# Patient Record
Sex: Female | Born: 1970 | Race: White | Hispanic: No | Smoking: Never smoker
Health system: Southern US, Community
[De-identification: ages and names within clinical notes are randomized; demographics above are authoritative.]

## PROBLEM LIST (undated history)

## (undated) DIAGNOSIS — I10 Essential (primary) hypertension: Secondary | ICD-10-CM

## (undated) HISTORY — DX: Essential (primary) hypertension: I10

## (undated) HISTORY — PX: CHOLECYSTECTOMY: SHX55

---

## 1998-06-04 ENCOUNTER — Other Ambulatory Visit: Admission: RE | Admit: 1998-06-04 | Discharge: 1998-06-04 | Payer: Self-pay | Admitting: Obstetrics & Gynecology

## 1998-12-20 ENCOUNTER — Inpatient Hospital Stay (HOSPITAL_COMMUNITY): Admission: AD | Admit: 1998-12-20 | Discharge: 1998-12-24 | Payer: Self-pay | Admitting: Obstetrics & Gynecology

## 2000-03-13 ENCOUNTER — Other Ambulatory Visit: Admission: RE | Admit: 2000-03-13 | Discharge: 2000-03-13 | Payer: Self-pay | Admitting: Obstetrics & Gynecology

## 2001-04-12 ENCOUNTER — Other Ambulatory Visit: Admission: RE | Admit: 2001-04-12 | Discharge: 2001-04-12 | Payer: Self-pay | Admitting: Obstetrics & Gynecology

## 2002-04-23 ENCOUNTER — Other Ambulatory Visit: Admission: RE | Admit: 2002-04-23 | Discharge: 2002-04-23 | Payer: Self-pay | Admitting: Obstetrics & Gynecology

## 2003-07-03 ENCOUNTER — Other Ambulatory Visit: Admission: RE | Admit: 2003-07-03 | Discharge: 2003-07-03 | Payer: Self-pay | Admitting: Obstetrics & Gynecology

## 2004-08-18 ENCOUNTER — Other Ambulatory Visit: Admission: RE | Admit: 2004-08-18 | Discharge: 2004-08-18 | Payer: Self-pay | Admitting: Obstetrics & Gynecology

## 2004-11-21 HISTORY — PX: PARTIAL HYSTERECTOMY: SHX80

## 2005-02-23 ENCOUNTER — Observation Stay (HOSPITAL_COMMUNITY): Admission: RE | Admit: 2005-02-23 | Discharge: 2005-02-24 | Payer: Self-pay | Admitting: Obstetrics & Gynecology

## 2005-09-15 ENCOUNTER — Other Ambulatory Visit: Admission: RE | Admit: 2005-09-15 | Discharge: 2005-09-15 | Payer: Self-pay | Admitting: Obstetrics and Gynecology

## 2012-08-16 ENCOUNTER — Other Ambulatory Visit: Payer: Self-pay | Admitting: Obstetrics & Gynecology

## 2015-03-02 ENCOUNTER — Other Ambulatory Visit: Payer: Self-pay | Admitting: Obstetrics & Gynecology

## 2015-03-02 DIAGNOSIS — N6315 Unspecified lump in the right breast, overlapping quadrants: Secondary | ICD-10-CM

## 2015-03-02 DIAGNOSIS — N6310 Unspecified lump in the right breast, unspecified quadrant: Principal | ICD-10-CM

## 2015-03-04 ENCOUNTER — Ambulatory Visit
Admission: RE | Admit: 2015-03-04 | Discharge: 2015-03-04 | Disposition: A | Discharge status: Home / Self Care | Payer: BLUE CROSS/BLUE SHIELD | Source: Ambulatory Visit | Attending: Obstetrics & Gynecology | Admitting: Obstetrics & Gynecology

## 2015-03-04 DIAGNOSIS — N6310 Unspecified lump in the right breast, unspecified quadrant: Principal | ICD-10-CM

## 2015-03-04 DIAGNOSIS — N6315 Unspecified lump in the right breast, overlapping quadrants: Secondary | ICD-10-CM

## 2016-01-18 ENCOUNTER — Other Ambulatory Visit: Payer: Self-pay | Admitting: Obstetrics & Gynecology

## 2016-01-18 DIAGNOSIS — N63 Unspecified lump in unspecified breast: Secondary | ICD-10-CM

## 2016-01-21 ENCOUNTER — Other Ambulatory Visit: Payer: Self-pay | Admitting: Obstetrics & Gynecology

## 2016-01-21 ENCOUNTER — Ambulatory Visit
Admission: RE | Admit: 2016-01-21 | Discharge: 2016-01-21 | Disposition: A | Discharge status: Home / Self Care | Payer: BLUE CROSS/BLUE SHIELD | Source: Ambulatory Visit | Attending: Obstetrics & Gynecology | Admitting: Obstetrics & Gynecology

## 2016-01-21 DIAGNOSIS — N63 Unspecified lump in unspecified breast: Secondary | ICD-10-CM

## 2017-05-03 ENCOUNTER — Other Ambulatory Visit: Payer: Self-pay | Admitting: Obstetrics & Gynecology

## 2017-05-03 DIAGNOSIS — N63 Unspecified lump in unspecified breast: Secondary | ICD-10-CM

## 2017-05-05 ENCOUNTER — Other Ambulatory Visit: Payer: BLUE CROSS/BLUE SHIELD

## 2017-05-05 ENCOUNTER — Inpatient Hospital Stay
Admission: RE | Admit: 2017-05-05 | Discharge: 2017-05-05 | Disposition: A | Discharge status: Home / Self Care | Payer: BLUE CROSS/BLUE SHIELD | Source: Ambulatory Visit | Attending: Obstetrics & Gynecology | Admitting: Obstetrics & Gynecology

## 2017-05-15 ENCOUNTER — Other Ambulatory Visit: Payer: BLUE CROSS/BLUE SHIELD

## 2017-06-07 ENCOUNTER — Ambulatory Visit
Admission: RE | Admit: 2017-06-07 | Discharge: 2017-06-07 | Disposition: A | Discharge status: Home / Self Care | Payer: BLUE CROSS/BLUE SHIELD | Source: Ambulatory Visit | Attending: Obstetrics & Gynecology | Admitting: Obstetrics & Gynecology

## 2017-06-07 ENCOUNTER — Other Ambulatory Visit: Payer: Self-pay | Admitting: Obstetrics & Gynecology

## 2017-06-07 DIAGNOSIS — N63 Unspecified lump in unspecified breast: Secondary | ICD-10-CM

## 2017-06-27 ENCOUNTER — Other Ambulatory Visit: Payer: Self-pay | Admitting: Obstetrics & Gynecology

## 2017-06-27 DIAGNOSIS — N6002 Solitary cyst of left breast: Secondary | ICD-10-CM

## 2017-07-03 ENCOUNTER — Ambulatory Visit
Admission: RE | Admit: 2017-07-03 | Discharge: 2017-07-03 | Disposition: A | Discharge status: Home / Self Care | Payer: BLUE CROSS/BLUE SHIELD | Source: Ambulatory Visit | Attending: Obstetrics & Gynecology | Admitting: Obstetrics & Gynecology

## 2017-07-03 ENCOUNTER — Other Ambulatory Visit: Payer: Self-pay | Admitting: Obstetrics & Gynecology

## 2017-07-03 DIAGNOSIS — N6002 Solitary cyst of left breast: Secondary | ICD-10-CM

## 2017-11-29 DIAGNOSIS — J22 Unspecified acute lower respiratory infection: Secondary | ICD-10-CM | POA: Diagnosis not present

## 2017-12-25 DIAGNOSIS — M545 Low back pain: Secondary | ICD-10-CM | POA: Diagnosis not present

## 2017-12-25 DIAGNOSIS — M5416 Radiculopathy, lumbar region: Secondary | ICD-10-CM | POA: Diagnosis not present

## 2017-12-27 DIAGNOSIS — M545 Low back pain: Secondary | ICD-10-CM | POA: Diagnosis not present

## 2018-01-05 DIAGNOSIS — M545 Low back pain: Secondary | ICD-10-CM | POA: Diagnosis not present

## 2018-01-22 DIAGNOSIS — E669 Obesity, unspecified: Secondary | ICD-10-CM | POA: Diagnosis not present

## 2018-01-22 DIAGNOSIS — I10 Essential (primary) hypertension: Secondary | ICD-10-CM | POA: Diagnosis not present

## 2018-01-22 DIAGNOSIS — M545 Low back pain: Secondary | ICD-10-CM | POA: Diagnosis not present

## 2018-01-22 DIAGNOSIS — M5416 Radiculopathy, lumbar region: Secondary | ICD-10-CM | POA: Diagnosis not present

## 2018-02-01 DIAGNOSIS — M545 Low back pain: Secondary | ICD-10-CM | POA: Diagnosis not present

## 2018-02-01 DIAGNOSIS — M5416 Radiculopathy, lumbar region: Secondary | ICD-10-CM | POA: Diagnosis not present

## 2018-02-05 DIAGNOSIS — M545 Low back pain: Secondary | ICD-10-CM | POA: Diagnosis not present

## 2018-02-05 DIAGNOSIS — M5416 Radiculopathy, lumbar region: Secondary | ICD-10-CM | POA: Diagnosis not present

## 2018-02-08 DIAGNOSIS — M5416 Radiculopathy, lumbar region: Secondary | ICD-10-CM | POA: Diagnosis not present

## 2018-02-08 DIAGNOSIS — M545 Low back pain: Secondary | ICD-10-CM | POA: Diagnosis not present

## 2018-05-16 DIAGNOSIS — Z1231 Encounter for screening mammogram for malignant neoplasm of breast: Secondary | ICD-10-CM | POA: Diagnosis not present

## 2018-05-16 DIAGNOSIS — Z01419 Encounter for gynecological examination (general) (routine) without abnormal findings: Secondary | ICD-10-CM | POA: Diagnosis not present

## 2018-05-16 DIAGNOSIS — Z6831 Body mass index (BMI) 31.0-31.9, adult: Secondary | ICD-10-CM | POA: Diagnosis not present

## 2018-05-28 DIAGNOSIS — J0100 Acute maxillary sinusitis, unspecified: Secondary | ICD-10-CM | POA: Diagnosis not present

## 2018-06-04 DIAGNOSIS — J0100 Acute maxillary sinusitis, unspecified: Secondary | ICD-10-CM | POA: Diagnosis not present

## 2018-06-19 DIAGNOSIS — R0981 Nasal congestion: Secondary | ICD-10-CM | POA: Diagnosis not present

## 2018-06-19 DIAGNOSIS — J320 Chronic maxillary sinusitis: Secondary | ICD-10-CM | POA: Diagnosis not present

## 2018-07-20 DIAGNOSIS — D1801 Hemangioma of skin and subcutaneous tissue: Secondary | ICD-10-CM | POA: Diagnosis not present

## 2018-07-20 DIAGNOSIS — D485 Neoplasm of uncertain behavior of skin: Secondary | ICD-10-CM | POA: Diagnosis not present

## 2018-07-20 DIAGNOSIS — D225 Melanocytic nevi of trunk: Secondary | ICD-10-CM | POA: Diagnosis not present

## 2018-07-20 DIAGNOSIS — D2262 Melanocytic nevi of left upper limb, including shoulder: Secondary | ICD-10-CM | POA: Diagnosis not present

## 2018-09-27 DIAGNOSIS — D492 Neoplasm of unspecified behavior of bone, soft tissue, and skin: Secondary | ICD-10-CM | POA: Diagnosis not present

## 2018-09-27 DIAGNOSIS — M79671 Pain in right foot: Secondary | ICD-10-CM | POA: Diagnosis not present

## 2018-09-27 DIAGNOSIS — Z6831 Body mass index (BMI) 31.0-31.9, adult: Secondary | ICD-10-CM | POA: Diagnosis not present

## 2018-09-27 DIAGNOSIS — M7731 Calcaneal spur, right foot: Secondary | ICD-10-CM | POA: Diagnosis not present

## 2018-10-04 DIAGNOSIS — D1801 Hemangioma of skin and subcutaneous tissue: Secondary | ICD-10-CM | POA: Diagnosis not present

## 2018-10-09 ENCOUNTER — Other Ambulatory Visit: Payer: Self-pay

## 2018-10-09 ENCOUNTER — Ambulatory Visit: Payer: BLUE CROSS/BLUE SHIELD | Admitting: Podiatry

## 2018-10-09 ENCOUNTER — Encounter: Payer: Self-pay | Admitting: Podiatry

## 2018-10-09 VITALS — BP 133/84 | HR 88 | Resp 16 | Ht 60.0 in | Wt 155.0 lb

## 2018-10-09 DIAGNOSIS — M7751 Other enthesopathy of right foot: Secondary | ICD-10-CM | POA: Diagnosis not present

## 2018-10-09 DIAGNOSIS — M79674 Pain in right toe(s): Secondary | ICD-10-CM

## 2018-10-09 DIAGNOSIS — M8430XA Stress fracture, unspecified site, initial encounter for fracture: Secondary | ICD-10-CM

## 2018-10-09 DIAGNOSIS — M659 Synovitis and tenosynovitis, unspecified: Secondary | ICD-10-CM | POA: Diagnosis not present

## 2018-10-09 DIAGNOSIS — M7989 Other specified soft tissue disorders: Secondary | ICD-10-CM

## 2018-10-09 MED ORDER — DICLOFENAC SODIUM 75 MG PO TBEC: 75.0000 mg | DELAYED_RELEASE_TABLET | Freq: Two times a day (BID) | ORAL | 0 refills | Status: DC

## 2018-10-09 NOTE — Progress Notes (Signed)
   Subjective:    Patient ID: Brenda Riddle, female    DOB: Apr 14, 1971, 47 y.o.   MRN: [id number]  HPI    Review of Systems  Musculoskeletal: Positive for arthralgias and myalgias.  All other systems reviewed and are negative.      Objective:   Physical Exam        Assessment & Plan:

## 2018-10-10 NOTE — Progress Notes (Signed)
  Subjective:  Patient ID: Brenda Riddle, female    DOB: Apr 21, 1971,  MRN: [id number]  Chief Complaint  Patient presents with  . Foot Pain    R sub met 2 x Mid Sept; 5/10 pain -no known injury but it started after moving houses Tx: aleve, and OTC inserts   47 y.o. female presents with the above complaint. History as above.  Review of Systems: Negative except as noted in the HPI. Denies N/V/F/Ch.  No past medical history on file.  Current Outpatient Medications:  .  fluticasone (FLONASE) 50 MCG/ACT nasal spray, INSTILL 2 SPRAYS EACH NOSTRIL EVERY NIGHT., Disp: , Rfl: 5 .  lisinopril (PRINIVIL,ZESTRIL) 10 MG tablet, , Disp: , Rfl:  .  diclofenac (VOLTAREN) 75 MG EC tablet, Take 1 tablet (75 mg total) by mouth 2 (two) times daily. With food, Disp: 60 tablet, Rfl: 0  Social History   Tobacco Use  Smoking Status Never Smoker  Smokeless Tobacco Never Used    Allergies  Allergen Reactions  . Meloxicam Rash  . Penicillin G Rash   Objective:   Vitals:   10/09/18 1456  BP: 133/84  Pulse: 88  Resp: 16   Body mass index is 30.27 kg/m. Constitutional Well developed. Well nourished.  Vascular Dorsalis pedis pulses palpable bilaterally. Posterior tibial pulses palpable bilaterally. Capillary refill normal to all digits.  No cyanosis or clubbing noted. Pedal hair growth normal.  Neurologic Normal speech. Oriented to person, place, and time. Epicritic sensation to light touch grossly present bilaterally.  Dermatologic Nails well groomed and normal in appearance. No open wounds. No skin lesions.  Orthopedic: Normal joint ROM without pain or crepitus bilaterally. No visible deformities. POP R 2nd MPJ   Radiographs: XR reviewed no evidence of stress fracture. Assessment:   1. Capsulitis of metatarsophalangeal (MTP) joint of right foot   2. Stress reaction of bone   3. Pain and swelling of toe of right foot   4. Synovitis and tenosynovitis    Plan:  Patient was  evaluated and treated and all questions answered.  R 2nd MPJ Capsulitis -Likely 2/2 overuse -XR reviewed from [facility] no acute fractures or dislocations. Non WB films -Injection as below. -Rx Diclofenac. Meloxicam failed to help. Educated on risks benefits and use of medication.  Procedure: Joint Injection Location: Right 2nd MPJ joint Skin Prep: Alcohol. Injectate: 0.5 cc 1% lidocaine plain, 0.5 cc dexamethasone phosphate. Disposition: Patient tolerated procedure well. Injection site dressed with a band-aid.  If pain persists at next visit will get WB XR. F/u in 3 weeks.

## 2018-11-05 DIAGNOSIS — R21 Rash and other nonspecific skin eruption: Secondary | ICD-10-CM | POA: Diagnosis not present

## 2018-11-05 DIAGNOSIS — N3000 Acute cystitis without hematuria: Secondary | ICD-10-CM | POA: Diagnosis not present

## 2018-11-06 ENCOUNTER — Ambulatory Visit: Payer: BLUE CROSS/BLUE SHIELD | Admitting: Podiatry

## 2018-11-06 ENCOUNTER — Ambulatory Visit (INDEPENDENT_AMBULATORY_CARE_PROVIDER_SITE_OTHER): Payer: BLUE CROSS/BLUE SHIELD

## 2018-11-06 DIAGNOSIS — M7751 Other enthesopathy of right foot: Secondary | ICD-10-CM

## 2018-11-06 DIAGNOSIS — M79674 Pain in right toe(s): Secondary | ICD-10-CM

## 2018-11-06 DIAGNOSIS — M8430XA Stress fracture, unspecified site, initial encounter for fracture: Secondary | ICD-10-CM

## 2018-11-06 DIAGNOSIS — M659 Synovitis and tenosynovitis, unspecified: Secondary | ICD-10-CM | POA: Diagnosis not present

## 2018-11-06 DIAGNOSIS — M7989 Other specified soft tissue disorders: Secondary | ICD-10-CM

## 2018-11-18 NOTE — Progress Notes (Signed)
  Subjective:  Patient ID: Brenda Riddle, female    DOB: 1970-12-11,  MRN: [id number]  Chief Complaint  Patient presents with  . capsulitis    F/U R capsulitis states," It's still sore on the ball of my foot, but now it hurts on the outer side of my foot, I think it's because of the padding it's making my foot roll over." Tx: taping and padding -pt states she is not taking diclofenac due to a rash    47 y.o. female presents with the above complaint. History as above.  Review of Systems: Negative except as noted in the HPI. Denies N/V/F/Ch.  No past medical history on file.  Current Outpatient Medications:  .  diclofenac (VOLTAREN) 75 MG EC tablet, Take 1 tablet (75 mg total) by mouth 2 (two) times daily. With food, Disp: 60 tablet, Rfl: 0 .  fluticasone (FLONASE) 50 MCG/ACT nasal spray, INSTILL 2 SPRAYS EACH NOSTRIL EVERY NIGHT., Disp: , Rfl: 5 .  lisinopril (PRINIVIL,ZESTRIL) 10 MG tablet, , Disp: , Rfl:   Social History   Tobacco Use  Smoking Status Never Smoker  Smokeless Tobacco Never Used    Allergies  Allergen Reactions  . Meloxicam Rash  . Penicillin G Rash   Objective:   There were no vitals filed for this visit. There is no height or weight on file to calculate BMI. Constitutional Well developed. Well nourished.  Vascular Dorsalis pedis pulses palpable bilaterally. Posterior tibial pulses palpable bilaterally. Capillary refill normal to all digits.  No cyanosis or clubbing noted. Pedal hair growth normal.  Neurologic Normal speech. Oriented to person, place, and time. Epicritic sensation to light touch grossly present bilaterally.  Dermatologic Nails well groomed and normal in appearance. No open wounds. No skin lesions.  Orthopedic: Normal joint ROM without pain or crepitus bilaterally. No visible deformities. POP R 2nd MPJ   Radiographs: XR reviewed no evidence of stress fracture. Assessment:   1. Capsulitis of metatarsophalangeal (MTP) joint of  right foot   2. Stress reaction of bone   3. Pain and swelling of toe of right foot   4. Synovitis and tenosynovitis    Plan:  Patient was evaluated and treated and all questions answered.  R 2nd MPJ Capsulitis -Weightbearing films reviewed slight metatarsal abnormality -Would benefit from custom orthotics to offload the second metatarsal area.  Casted today follow-up after fabrication

## 2018-12-12 DIAGNOSIS — R112 Nausea with vomiting, unspecified: Secondary | ICD-10-CM | POA: Diagnosis not present

## 2018-12-12 DIAGNOSIS — I10 Essential (primary) hypertension: Secondary | ICD-10-CM | POA: Diagnosis not present

## 2018-12-12 DIAGNOSIS — R1011 Right upper quadrant pain: Secondary | ICD-10-CM | POA: Diagnosis not present

## 2018-12-14 DIAGNOSIS — R100 Acute abdomen: Secondary | ICD-10-CM | POA: Diagnosis not present

## 2018-12-18 DIAGNOSIS — R110 Nausea: Secondary | ICD-10-CM | POA: Diagnosis not present

## 2018-12-18 DIAGNOSIS — R1011 Right upper quadrant pain: Secondary | ICD-10-CM | POA: Diagnosis not present

## 2018-12-19 DIAGNOSIS — I10 Essential (primary) hypertension: Secondary | ICD-10-CM | POA: Diagnosis not present

## 2018-12-19 DIAGNOSIS — R1011 Right upper quadrant pain: Secondary | ICD-10-CM | POA: Diagnosis not present

## 2018-12-19 DIAGNOSIS — R112 Nausea with vomiting, unspecified: Secondary | ICD-10-CM | POA: Diagnosis not present

## 2018-12-19 DIAGNOSIS — K811 Chronic cholecystitis: Secondary | ICD-10-CM | POA: Diagnosis not present

## 2018-12-21 DIAGNOSIS — Z01810 Encounter for preprocedural cardiovascular examination: Secondary | ICD-10-CM | POA: Diagnosis not present

## 2018-12-21 DIAGNOSIS — I2119 ST elevation (STEMI) myocardial infarction involving other coronary artery of inferior wall: Secondary | ICD-10-CM | POA: Diagnosis not present

## 2018-12-21 DIAGNOSIS — K828 Other specified diseases of gallbladder: Secondary | ICD-10-CM | POA: Diagnosis not present

## 2018-12-24 DIAGNOSIS — K839 Disease of biliary tract, unspecified: Secondary | ICD-10-CM | POA: Diagnosis not present

## 2018-12-24 DIAGNOSIS — I10 Essential (primary) hypertension: Secondary | ICD-10-CM | POA: Diagnosis not present

## 2018-12-24 DIAGNOSIS — K8010 Calculus of gallbladder with chronic cholecystitis without obstruction: Secondary | ICD-10-CM | POA: Diagnosis not present

## 2018-12-24 DIAGNOSIS — K819 Cholecystitis, unspecified: Secondary | ICD-10-CM | POA: Diagnosis not present

## 2018-12-24 DIAGNOSIS — K811 Chronic cholecystitis: Secondary | ICD-10-CM | POA: Diagnosis not present

## 2018-12-25 ENCOUNTER — Ambulatory Visit: Payer: BLUE CROSS/BLUE SHIELD | Admitting: Cardiology

## 2019-01-10 ENCOUNTER — Ambulatory Visit: Payer: BLUE CROSS/BLUE SHIELD | Admitting: Cardiology

## 2019-01-23 NOTE — Progress Notes (Deleted)
Cardiology Office Note:    Date:  01/23/2019   ID:  Brenda Riddle, DOB [birth year], MRN [id number]  PCP:  Greig Right, MD  Cardiologist:  Shirlee More, MD   Referring MD: Greig Right, MD  ASSESSMENT:    No diagnosis found. PLAN:    In order of problems listed above:  1. ***  Next appointment   Medication Adjustments/Labs and Tests Ordered: Current medicines are reviewed at length with the patient today.  Concerns regarding medicines are outlined above.  No orders of the defined types were placed in this encounter.  No orders of the defined types were placed in this encounter.    No chief complaint on file. ***  History of Present Illness:    Brenda Riddle is a 48 y.o. female who is being seen today for the evaluation of chest pain at the request of the patient.   No past medical history on file.  *** The histories are not reviewed yet. Please review them in the "History" navigator section and refresh this [place].  Current Medications: No outpatient medications have been marked as taking for the 01/24/19 encounter (Appointment) with Richardo Priest, MD.     Allergies:   Meloxicam and Penicillin g   Social History   Socioeconomic History  . Marital status: Married    Spouse name: Not on file  . Number of children: Not on file  . Years of education: Not on file  . Highest education level: Not on file  Occupational History  . Not on file  Social Needs  . Financial resource strain: Not on file  . Food insecurity:    Worry: Not on file    Inability: Not on file  . Transportation needs:    Medical: Not on file    Non-medical: Not on file  Tobacco Use  . Smoking status: Never Smoker  . Smokeless tobacco: Never Used  Substance and Sexual Activity  . Alcohol use: Not on file  . Drug use: Not on file  . Sexual activity: Not on file  Lifestyle  . Physical activity:    Days per week: Not on file    Minutes per session: Not on file  .  Stress: Not on file  Relationships  . Social connections:    Talks on phone: Not on file    Gets together: Not on file    Attends religious service: Not on file    Active member of club or organization: Not on file    Attends meetings of clubs or organizations: Not on file    Relationship status: Not on file  Other Topics Concern  . Not on file  Social History Narrative  . Not on file     Family History: The patient's ***family history is not on file.  ROS:   ROS Please see the history of present illness.    *** All other systems reviewed and are negative.  EKGs/Labs/Other Studies Reviewed:    The following studies were reviewed today: ***  EKG:  EKG is *** ordered today.  The ekg ordered today is personally reviewed and demonstrates ***  Recent Labs: No results found for requested labs within last 8760 hours.  Recent Lipid Panel No results found for: CHOL, TRIG, HDL, CHOLHDL, VLDL, LDLCALC, LDLDIRECT  Physical Exam:    VS:  There were no vitals taken for this visit.    Wt Readings from Last 3 Encounters:  10/09/18 155 lb (70.3 kg)  GEN: *** Well nourished, well developed in no acute distress HEENT: Normal NECK: No JVD; No carotid bruits LYMPHATICS: No lymphadenopathy CARDIAC: ***RRR, no murmurs, rubs, gallops RESPIRATORY:  Clear to auscultation without rales, wheezing or rhonchi  ABDOMEN: Soft, non-tender, non-distended MUSCULOSKELETAL:  No edema; No deformity  SKIN: Warm and dry NEUROLOGIC:  Alert and oriented x 3 PSYCHIATRIC:  Normal affect     Signed, Shirlee More, MD  01/23/2019 7:43 AM    [place]

## 2019-01-24 ENCOUNTER — Ambulatory Visit: Payer: BLUE CROSS/BLUE SHIELD | Admitting: Cardiology

## 2019-05-16 DIAGNOSIS — L821 Other seborrheic keratosis: Secondary | ICD-10-CM | POA: Diagnosis not present

## 2019-05-16 DIAGNOSIS — D225 Melanocytic nevi of trunk: Secondary | ICD-10-CM | POA: Diagnosis not present

## 2019-05-16 DIAGNOSIS — L814 Other melanin hyperpigmentation: Secondary | ICD-10-CM | POA: Diagnosis not present

## 2019-05-16 DIAGNOSIS — D485 Neoplasm of uncertain behavior of skin: Secondary | ICD-10-CM | POA: Diagnosis not present

## 2019-06-04 DIAGNOSIS — Z6832 Body mass index (BMI) 32.0-32.9, adult: Secondary | ICD-10-CM | POA: Diagnosis not present

## 2019-06-04 DIAGNOSIS — I10 Essential (primary) hypertension: Secondary | ICD-10-CM | POA: Diagnosis not present

## 2019-06-04 DIAGNOSIS — E669 Obesity, unspecified: Secondary | ICD-10-CM | POA: Diagnosis not present

## 2019-08-21 DIAGNOSIS — Z1231 Encounter for screening mammogram for malignant neoplasm of breast: Secondary | ICD-10-CM | POA: Diagnosis not present

## 2019-08-21 DIAGNOSIS — Z01419 Encounter for gynecological examination (general) (routine) without abnormal findings: Secondary | ICD-10-CM | POA: Diagnosis not present

## 2019-08-21 DIAGNOSIS — Z6831 Body mass index (BMI) 31.0-31.9, adult: Secondary | ICD-10-CM | POA: Diagnosis not present

## 2019-09-03 DIAGNOSIS — Z1211 Encounter for screening for malignant neoplasm of colon: Secondary | ICD-10-CM | POA: Diagnosis not present

## 2019-09-26 DIAGNOSIS — I781 Nevus, non-neoplastic: Secondary | ICD-10-CM | POA: Diagnosis not present

## 2019-09-26 DIAGNOSIS — D1801 Hemangioma of skin and subcutaneous tissue: Secondary | ICD-10-CM | POA: Diagnosis not present

## 2019-10-09 DIAGNOSIS — S93501A Unspecified sprain of right great toe, initial encounter: Secondary | ICD-10-CM | POA: Diagnosis not present

## 2019-10-10 NOTE — Progress Notes (Signed)
Cardiology Office Note:    Date:  10/11/2019   ID:  Brenda Riddle, DOB 09-28-1971, MRN [id number]  PCP:  Brenda Right, MD  Cardiologist:  Shirlee More, MD   Referring MD: Brenda Right, MD  ASSESSMENT:    1. Chest pain in adult   2. Precordial pain    PLAN:    In order of problems listed above:  1. Symptoms are atypical concerns with family history of premature CAD further evaluation with coronary calcium score cardiac CTA and follow-up 6 weeks to review results  Next appointment 6 weeks  Medication Adjustments/Labs and Tests Ordered: Current medicines are reviewed at length with the patient today.  Concerns regarding medicines are outlined above.  Orders Placed This Encounter  Procedures   CT CORONARY MORPH W/CTA COR W/SCORE W/CA W/CM &/OR WO/CM   CT CORONARY FRACTIONAL FLOW RESERVE DATA PREP   CT CORONARY FRACTIONAL FLOW RESERVE FLUID ANALYSIS   EKG 12-Lead   No orders of the defined types were placed in this encounter.    Chief Complaint  Patient presents with   Chest Pain    History of Present Illness:    Brenda Riddle is a 48 y.o. female who is being seen today for the evaluation of chest pain at the request of the patient  In January 2020 she had severe chest pain lasted for hours afterwards evaluated and underwent cholecystectomy.  She has never had symptoms of this severity but since then several times a week she gets discomfort in the left chest she describes as an ache or heaviness it radiates up towards the left jaw with and without activity and not relieved with rest.  Severe pain last 5 to 10 minutes but at times it last to an hour and it does not follow a pattern except it occurs more frequently it does not awaken her from her sleep and is unassociated with her meals and has no heartburn or indigestion.  She has had no chest wall trauma or tenderness and no shortness of breath associated with it.  She is concerned with her father dying of  heart disease at age 96 and her family history.  Her EKG is normal.  Reviewed options for further evaluation including pharmacologic perfusion study or cardiac CTA with calcium score after discussion of benefits options and risk she elects to undergo cardiac CTA but tells that she has CAD and guide if she would benefit from revascularization or medical therapy for his nonobstructive and a calcium score will give [place] more prognostic information to recalculate her risk because at this time I would not advise a statin.  She has no dye allergy is not pregnant no contraindication to the CT scan  Past Medical History:  Diagnosis Date   Hypertension     Past Surgical History:  Procedure Laterality Date   CESAREAN SECTION  2000   CHOLECYSTECTOMY     PARTIAL HYSTERECTOMY  2006    Current Medications: Current Meds  Medication Sig   ECHINACEA-VITAMIN C PO Take 1 tablet by mouth 2 (two) times daily.   ELDERBERRY PO Take 1 tablet by mouth 2 (two) times daily.   fluticasone (FLONASE) 50 MCG/ACT nasal spray INSTILL 2 SPRAYS EACH NOSTRIL EVERY NIGHT.   lisinopril (PRINIVIL,ZESTRIL) 10 MG tablet Take 10 mg by mouth daily.    Multiple Vitamins-Minerals (HAIR SKIN AND NAILS FORMULA) TABS Take 1 tablet by mouth 2 (two) times daily.   Multiple Vitamins-Minerals (MULTIVITAMIN WOMEN PO) Take 1 tablet by mouth  daily.   NON FORMULARY Take 2 tablets by mouth daily. Vitamin D3, Echinacea, and ZINC     Allergies:   Meloxicam and Penicillin g   Social History   Socioeconomic History   Marital status: Married    Spouse name: Not on file   Number of children: Not on file   Years of education: Not on file   Highest education level: Not on file  Occupational History   Not on file  Social Needs   Financial resource strain: Not on file   Food insecurity    Worry: Not on file    Inability: Not on file   Transportation needs    Medical: Not on file    Non-medical: Not on file  Tobacco Use     Smoking status: Never Smoker   Smokeless tobacco: Never Used  Substance and Sexual Activity   Alcohol use: Yes    Comment: occ   Drug use: Never   Sexual activity: Not on file  Lifestyle   Physical activity    Days per week: Not on file    Minutes per session: Not on file   Stress: Not on file  Relationships   Social connections    Talks on phone: Not on file    Gets together: Not on file    Attends religious service: Not on file    Active member of club or organization: Not on file    Attends meetings of clubs or organizations: Not on file    Relationship status: Not on file  Other Topics Concern   Not on file  Social History Narrative   Not on file     Family History: The patient's family history includes Diabetes in her maternal aunt; Heart attack in her father; Hypertension in her mother.  ROS:   Review of Systems  Constitution: Negative.  HENT: Negative.   Eyes: Negative.   Cardiovascular: Positive for chest pain.  Respiratory: Negative.   Endocrine: Negative.   Hematologic/Lymphatic: Negative.   Skin: Negative.   Musculoskeletal: Negative.   Gastrointestinal: Negative.   Genitourinary: Negative.   Neurological: Positive for numbness (of toe).  Psychiatric/Behavioral: Negative.   Allergic/Immunologic: Negative.    Please see the history of present illness.     All other systems reviewed and are negative.  EKGs/Labs/Other Studies Reviewed:    The following studies were reviewed today: COVID-19 test was undetectable normal performed at [place] on 09/22/2019  EKG:  EKG is  ordered today.  The ekg ordered today is personally reviewed and demonstrates sinus rhythm normal  E[place] - [place]/2020 [facility] Result Narrative  Ventricular Rate          81    BPM          Atrial Rate            81    BPM          P-R Interval            144    ms           QRS Duration            68    ms          Q-T Interval            366    ms          QTC  425    ms          P Axis               21    degrees        R Axis               -11    degrees        T Axis               -2    degrees        Sinus rhythm with sinus arrhythmia    Recent Labs: Performed 06/04/2019 cholesterol 169 LDL 78 HDL 51 A1c 5.1% normal renal function creatinine 0.81  Physical Exam:    VS:  BP (!) 130/100 (BP Location: Left Arm, Patient Position: Sitting, Cuff Size: Normal)    Pulse 93    Ht 5' (1.524 m)    Wt 160 lb (72.6 kg)    SpO2 98%    BMI 31.25 kg/m     Wt Readings from Last 3 Encounters:  10/11/19 160 lb (72.6 kg)  10/09/18 155 lb (70.3 kg)     GEN:  Well nourished, well developed in no acute distress HEENT: Normal NECK: No JVD; No carotid bruits LYMPHATICS: No lymphadenopathy CARDIAC: She has no chest wall tenderness RRR, no murmurs, rubs, gallops RESPIRATORY:  Clear to auscultation without rales, wheezing or rhonchi  ABDOMEN: Soft, non-tender, non-distended MUSCULOSKELETAL:  No edema; No deformity  SKIN: Warm and dry NEUROLOGIC:  Alert and oriented x 3 PSYCHIATRIC:  Normal affect     Signed, Shirlee More, MD  10/11/2019 4:20 PM    [facility]

## 2019-10-11 ENCOUNTER — Ambulatory Visit (INDEPENDENT_AMBULATORY_CARE_PROVIDER_SITE_OTHER): Payer: BC Managed Care – PPO | Admitting: Cardiology

## 2019-10-11 ENCOUNTER — Encounter: Payer: Self-pay | Admitting: Cardiology

## 2019-10-11 ENCOUNTER — Other Ambulatory Visit: Payer: Self-pay

## 2019-10-11 VITALS — BP 130/100 | HR 93 | Ht 60.0 in | Wt 160.0 lb

## 2019-10-11 DIAGNOSIS — R072 Precordial pain: Secondary | ICD-10-CM | POA: Diagnosis not present

## 2019-10-11 DIAGNOSIS — R079 Chest pain, unspecified: Secondary | ICD-10-CM | POA: Diagnosis not present

## 2019-10-11 MED ORDER — METOPROLOL TARTRATE 50 MG PO TABS: 50.0000 mg | ORAL_TABLET | Freq: Once | ORAL | 0 refills | Status: AC

## 2019-10-11 NOTE — Patient Instructions (Signed)
Medication Instructions:  Your physician recommends that you continue on your current medications as directed. Please refer to the Current Medication list given to you today.  *If you need a refill on your cardiac medications before your next appointment, please call your pharmacy*  Lab Work: You will need BMP 7 days prior to Cardiac CTA.  There is no appointment needed.  Please return to our office once you are scheduled for Cardiac CTA.   If you have labs (blood work) drawn today and your tests are completely normal, you will receive your results only by: Marland Kitchen MyChart Message (if you have MyChart) OR . A paper copy in the mail If you have any lab test that is abnormal or we need to change your treatment, we will call you to review the results.  Testing/Procedures: You had an EKG today.  Your cardiac CT will be scheduled at:  [facility] [address] [place], [place] [address] [phone]    Please arrive at the [facility] main entrance of [facility] 30-45 minutes prior to test start time. Proceed to the [facility] Radiology Department (first floor) to check-in and test prep.   Please follow these instructions carefully (unless otherwise directed):   On the Night Before the Test: . Be sure to Drink plenty of water. . Do not consume any caffeinated/decaffeinated beverages or chocolate 12 hours prior to your test. . Do not take any antihistamines 12 hours prior to your test.   On the Day of the Test: . Drink plenty of water. Do not drink any water within one hour of the test. . Do not eat any food 4 hours prior to the test. . You may take your regular medications prior to the test.  . Take metoprolol (Lopressor) two hours prior to test. . HOLD Furosemide/Hydrochlorothiazide morning of the test. . FEMALES- please wear underwire-free bra if available   *For Clinical Staff only. Please instruct patient the following:*        -Drink plenty of water        -Hold Furosemide/hydrochlorothiazide morning of the test       -Take metoprolol (Lopressor) 2 hours prior to test (if applicable).                  -If HR is less than 55 BPM- No Beta Blocker                -IF HR is greater than 55 BPM and patient is less than or equal to 54 yrs old Lopressor 145m x1.                      After the Test: . Drink plenty of water. . After receiving IV contrast, you may experience a mild flushed feeling. This is normal. . On occasion, you may experience a mild rash up to 24 hours after the test. This is not dangerous. If this occurs, you can take Benadryl 25 mg and increase your fluid intake. . If you experience trouble breathing, this can be serious. If it is severe call 911 IMMEDIATELY. If it is mild, please call our office. . If you take any of these medications: Glipizide/Metformin, Avandament, Glucavance, please do not take 48 hours after completing test unless otherwise instructed.   Once we have confirmed authorization from your insurance company, we will call you to set up a date and time for your test.   For non-scheduling related questions, please contact  the cardiac imaging nurse navigator should you have any questions/concerns: Marchia Bond, RN Navigator Cardiac Imaging [facility] [phone] Office     Follow-Up: At [facility], you and your health needs are our priority.  As part of our continuing mission to provide you with exceptional heart care, we have created designated Provider Care Teams.  These Care Teams include your primary Cardiologist (physician) and Advanced Practice Providers (APPs -  Physician Assistants and Nurse Practitioners) who all work together to provide you with the care you need, when you need it.  Your next appointment:   6 week(s)  The format for your next appointment:   In Person  Provider:   Shirlee More, MD   Cardiac CT Angiogram  A cardiac CT angiogram is a  procedure to look at the heart and the area around the heart. It may be done to help find the cause of chest pains or other symptoms of heart disease. During this procedure, a large X-ray machine, called a CT scanner, takes detailed pictures of the heart and the surrounding area after a dye (contrast material) has been injected into blood vessels in the area. The procedure is also sometimes called a coronary CT angiogram, coronary artery scanning, or CTA. A cardiac CT angiogram allows the health care provider to see how well blood is flowing to and from the heart. The health care provider will be able to see if there are any problems, such as:  Blockage or narrowing of the coronary arteries in the heart.  Fluid around the heart.  Signs of weakness or disease in the muscles, valves, and tissues of the heart. Tell a health care provider about:  Any allergies you have. This is especially important if you have had a previous allergic reaction to contrast dye.  All medicines you are taking, including vitamins, herbs, eye drops, creams, and over-the-counter medicines.  Any blood disorders you have.  Any surgeries you have had.  Any medical conditions you have.  Whether you are pregnant or may be pregnant.  Any anxiety disorders, chronic pain, or other conditions you have that may increase your stress or prevent you from lying still. What are the risks? Generally, this is a safe procedure. However, problems may occur, including:  Bleeding.  Infection.  Allergic reactions to medicines or dyes.  Damage to other structures or organs.  Kidney damage from the dye or contrast that is used.  Increased risk of cancer from radiation exposure. This risk is low. Talk with your health care provider about: ? The risks and benefits of testing. ? How you can receive the lowest dose of radiation. What happens before the procedure?  Wear comfortable clothing and remove any jewelry, glasses,  dentures, and hearing aids.  Follow instructions from your health care provider about eating and drinking. This may include: ? For 12 hours before the test - avoid caffeine. This includes tea, coffee, soda, energy drinks, and diet pills. Drink plenty of water or other fluids that do not have caffeine in them. Being well-hydrated can prevent complications. ? For 4-6 hours before the test - stop eating and drinking. The contrast dye can cause nausea, but this is less likely if your stomach is empty.  Ask your health care provider about changing or stopping your regular medicines. This is especially important if you are taking diabetes medicines, blood thinners, or medicines to treat erectile dysfunction. What happens during the procedure?  Hair on your chest may need to  be removed so that small sticky patches called electrodes can be placed on your chest. These will transmit information that helps to monitor your heart during the test.  An IV tube will be inserted into one of your veins.  You might be given a medicine to control your heart rate during the test. This will help to ensure that good images are obtained.  You will be asked to lie on an exam table. This table will slide in and out of the CT machine during the procedure.  Contrast dye will be injected into the IV tube. You might feel warm, or you may get a metallic taste in your mouth.  You will be given a medicine (nitroglycerin) to relax (dilate) the arteries in your heart.  The table that you are lying on will move into the CT machine tunnel for the scan.  The person running the machine will give you instructions while the scans are being done. You may be asked to: ? Keep your arms above your head. ? Hold your breath. ? Stay very still, even if the table is moving.  When the scanning is complete, you will be moved out of the machine.  The IV tube will be removed. The procedure may vary among health care providers and  hospitals. What happens after the procedure?  You might feel warm, or you may get a metallic taste in your mouth from the contrast dye.  You may have a headache from the nitroglycerin.  After the procedure, drink water or other fluids to wash (flush) the contrast material out of your body.  Contact a health care provider if you have any symptoms of allergy to the contrast. These symptoms include: ? Shortness of breath. ? Rash or hives. ? A racing heartbeat.  Most people can return to their normal activities right after the procedure. Ask your health care provider what activities are safe for you.  It is up to you to get the results of your procedure. Ask your health care provider, or the department that is doing the procedure, when your results will be ready. Summary  A cardiac CT angiogram is a procedure to look at the heart and the area around the heart. It may be done to help find the cause of chest pains or other symptoms of heart disease.  During this procedure, a large X-ray machine, called a CT scanner, takes detailed pictures of the heart and the surrounding area after a dye (contrast material) has been injected into blood vessels in the area.  Ask your health care provider about changing or stopping your regular medicines before the procedure. This is especially important if you are taking diabetes medicines, blood thinners, or medicines to treat erectile dysfunction.  After the procedure, drink water or other fluids to wash (flush) the contrast material out of your body. This information is not intended to replace advice given to you by your health care provider. Make sure you discuss any questions you have with your health care provider. Document Released: 10/20/2008 Document Revised: 10/20/2017 Document Reviewed: 09/26/2016 Elsevier Patient Education  2020 [employer].

## 2019-10-25 DIAGNOSIS — M5416 Radiculopathy, lumbar region: Secondary | ICD-10-CM | POA: Diagnosis not present

## 2019-11-02 DIAGNOSIS — Z20828 Contact with and (suspected) exposure to other viral communicable diseases: Secondary | ICD-10-CM | POA: Diagnosis not present

## 2019-11-04 DIAGNOSIS — Z20828 Contact with and (suspected) exposure to other viral communicable diseases: Secondary | ICD-10-CM | POA: Diagnosis not present

## 2019-11-04 DIAGNOSIS — R05 Cough: Secondary | ICD-10-CM | POA: Diagnosis not present

## 2019-11-04 DIAGNOSIS — J40 Bronchitis, not specified as acute or chronic: Secondary | ICD-10-CM | POA: Diagnosis not present

## 2019-11-04 DIAGNOSIS — J00 Acute nasopharyngitis [common cold]: Secondary | ICD-10-CM | POA: Diagnosis not present

## 2019-11-13 ENCOUNTER — Telehealth (HOSPITAL_COMMUNITY): Payer: Self-pay | Admitting: Emergency Medicine

## 2019-11-13 NOTE — Telephone Encounter (Signed)
Left message on voicemail with name and callback number Auriella Wieand RN Navigator Cardiac Imaging [facility] [phone] Office [phone] Cell  

## 2019-11-18 ENCOUNTER — Other Ambulatory Visit: Payer: Self-pay | Admitting: Cardiology

## 2019-11-18 ENCOUNTER — Telehealth: Payer: Self-pay | Admitting: Cardiology

## 2019-11-18 ENCOUNTER — Ambulatory Visit
Admission: RE | Admit: 2019-11-18 | Discharge: 2019-11-18 | Disposition: A | Discharge status: Home / Self Care | Payer: BC Managed Care – PPO | Source: Ambulatory Visit | Attending: Cardiology | Admitting: Cardiology

## 2019-11-18 ENCOUNTER — Other Ambulatory Visit: Payer: Self-pay

## 2019-11-18 DIAGNOSIS — R072 Precordial pain: Secondary | ICD-10-CM

## 2019-11-18 MED ORDER — DILTIAZEM HCL 25 MG/5ML IV SOLN
10.0000 mg | Freq: Once | INTRAVENOUS | Status: AC
  Administered 2019-11-18: 10 mg via INTRAVENOUS

## 2019-11-18 MED ORDER — NITROGLYCERIN 0.4 MG SL SUBL
0.8000 mg | SUBLINGUAL_TABLET | Freq: Once | SUBLINGUAL | Status: AC
  Administered 2019-11-18: 0.8 mg via SUBLINGUAL

## 2019-11-18 MED ORDER — METOPROLOL TARTRATE 5 MG/5ML IV SOLN
5.0000 mg | INTRAVENOUS | Status: DC | PRN
  Administered 2019-11-18 (×3): 5 mg via INTRAVENOUS

## 2019-11-18 MED ORDER — IOHEXOL 350 MG/ML SOLN
85.0000 mL | Freq: Once | INTRAVENOUS | Status: AC | PRN
  Administered 2019-11-18: 85 mL via INTRAVENOUS

## 2019-11-18 NOTE — Progress Notes (Signed)
Spoke with Dr Aundra Dubin regarding patient's heart rate. Patient heart 75-80 after 3 doses of Metoprolol 5 mg IVP. Ordered Diltiazem 10 mg IVP x 2. If unable to scan need to reschedule.

## 2019-11-18 NOTE — Telephone Encounter (Signed)
Did not get to have her CT today because they could not get her heart rate down

## 2019-11-18 NOTE — Progress Notes (Signed)
Unable to perform CT scan. Heart rate continued to stay above 75-80. Patient notified will receive a phone call to reschedule CT. Verbalized understanding.

## 2019-11-19 MED ORDER — METOPROLOL SUCCINATE ER 50 MG PO TB24: 50.0000 mg | ORAL_TABLET | Freq: Every day | ORAL | 0 refills | Status: DC

## 2019-11-19 NOTE — Addendum Note (Signed)
Addended by: Beckey Rutter on: 11/19/2019 04:49 PM   Modules accepted: Orders

## 2019-11-19 NOTE — Telephone Encounter (Signed)
Lets start a beta-blocker Toprol-XL 50 mg daily 1 week before repeat CTA attempt.

## 2019-11-19 NOTE — Telephone Encounter (Signed)
Patient CT was cancelled due to elevated BP. Medication sent but patient will have an insurance change on 11/22/19. RN will touch base with precert team tomorrow to arrange reschedule.

## 2019-11-21 NOTE — Addendum Note (Signed)
Addended by: Beckey Rutter on: 11/21/2019 10:22 AM   Modules accepted: Orders

## 2019-11-27 ENCOUNTER — Ambulatory Visit: Payer: BC Managed Care – PPO | Admitting: Cardiology

## 2019-12-05 ENCOUNTER — Telehealth: Payer: Self-pay | Admitting: Cardiology

## 2019-12-05 MED ORDER — METOPROLOL SUCCINATE ER 50 MG PO TB24: 50.0000 mg | ORAL_TABLET | Freq: Every day | ORAL | 2 refills | Status: DC

## 2019-12-05 NOTE — Telephone Encounter (Signed)
Lets go ahead and start on Toprol-XL 50 mg now daily and she will need a new prescription #30 with 2 refills

## 2019-12-05 NOTE — Telephone Encounter (Signed)
I spoke with patient. Cardiac CT was cancelled recently due to elevated heart rate. She is now scheduled to take Toprol 50 mg daily for one week prior to CT. Due to change in her insurance CT has not been scheduled yet. Patient saw primary care provider today and heart rate was elevated-101.  Patient reports she was asked to contact cardiology to see if she should be on a medication now to decrease heart rate.  Patient has an apple watch which shows her resting heart rate to be 95-100. Increases to around 112 with activity.

## 2019-12-05 NOTE — Telephone Encounter (Signed)
Patient could not have CT due to her heartrate being too high.  They ae in the process of getting her Pre-approval due to a new insurance effective the new year. She went to see her PCP this week and her heart rate is very high and PCP is questioning should she be on a beta blocker every day. Please call her.

## 2019-12-05 NOTE — Telephone Encounter (Signed)
No answer. Will try later to reach patient

## 2019-12-05 NOTE — Addendum Note (Signed)
Addended by: Thompson Grayer on: 12/05/2019 01:27 PM   Modules accepted: Orders

## 2019-12-05 NOTE — Telephone Encounter (Signed)
Pt notified of information from Dr Bettina Gavia. Prescription sent to [facility] on [address]

## 2019-12-06 NOTE — Telephone Encounter (Signed)
error 

## 2019-12-24 ENCOUNTER — Telehealth: Payer: Self-pay | Admitting: Cardiology

## 2019-12-24 DIAGNOSIS — R072 Precordial pain: Secondary | ICD-10-CM

## 2019-12-24 NOTE — Telephone Encounter (Signed)
Pat   A new order will need to be put in, the referral expired. As soon as the order is in I will send to precert so that we can get her scheduled.

## 2019-12-24 NOTE — Telephone Encounter (Signed)
New Message    Pt is calling to schedule a CT    Please call back

## 2019-12-25 NOTE — Telephone Encounter (Signed)
New/ updated orders placed for CT per scheduler/ precert request.

## 2019-12-27 ENCOUNTER — Ambulatory Visit: Payer: 59 | Admitting: Cardiology

## 2020-01-22 ENCOUNTER — Telehealth (HOSPITAL_COMMUNITY): Payer: Self-pay | Admitting: Emergency Medicine

## 2020-01-22 DIAGNOSIS — R079 Chest pain, unspecified: Secondary | ICD-10-CM

## 2020-01-22 MED ORDER — IVABRADINE HCL 5 MG PO TABS: ORAL_TABLET | ORAL | 0 refills | Status: DC

## 2020-01-22 NOTE — Telephone Encounter (Signed)
Reaching out to patient to offer assistance regarding upcoming cardiac imaging study; pt verbalizes understanding of appt date/time, parking situation and where to check in, pre-test NPO status and medications ordered, and verified current allergies; name and call back number provided for further questions should they arise Marchia Bond RN Navigator Cardiac Imaging [facility] [phone] office [phone] cell  Pt mentioned that her HR is always elevated. Attempted CCTA in [place] several weeks ago and turned away d/t inability to control HR. Verbal order from [facility] to give 26m ivabradine 2 hr prior to appt. Pt verbalized understanding. Will have a ride bring her to/from appt SClarise Cruz

## 2020-01-23 ENCOUNTER — Other Ambulatory Visit: Payer: Self-pay

## 2020-01-23 ENCOUNTER — Telehealth: Payer: Self-pay | Admitting: Cardiology

## 2020-01-23 ENCOUNTER — Ambulatory Visit (HOSPITAL_COMMUNITY)
Admission: RE | Admit: 2020-01-23 | Discharge: 2020-01-23 | Disposition: A | Discharge status: Home / Self Care | Payer: 59 | Source: Ambulatory Visit | Attending: Cardiology | Admitting: Cardiology

## 2020-01-23 ENCOUNTER — Telehealth: Payer: Self-pay

## 2020-01-23 DIAGNOSIS — R072 Precordial pain: Secondary | ICD-10-CM | POA: Insufficient documentation

## 2020-01-23 MED ORDER — METOPROLOL TARTRATE 5 MG/5ML IV SOLN
5.0000 mg | INTRAVENOUS | Status: DC | PRN
  Administered 2020-01-23: 5 mg via INTRAVENOUS

## 2020-01-23 MED ORDER — NITROGLYCERIN 0.4 MG SL SUBL: 0.8000 mg | SUBLINGUAL_TABLET | SUBLINGUAL | Status: DC | PRN

## 2020-01-23 MED ORDER — NITROGLYCERIN 0.4 MG SL SUBL
SUBLINGUAL_TABLET | SUBLINGUAL | Status: AC
  Administered 2020-01-23: 0.8 mg via SUBLINGUAL
  Filled 2020-01-23: qty 2

## 2020-01-23 MED ORDER — IOHEXOL 350 MG/ML SOLN
80.0000 mL | Freq: Once | INTRAVENOUS | Status: AC | PRN
  Administered 2020-01-23: 80 mL via INTRAVENOUS

## 2020-01-23 MED ORDER — METOPROLOL TARTRATE 5 MG/5ML IV SOLN
INTRAVENOUS | Status: AC
  Filled 2020-01-23: qty 10

## 2020-01-23 NOTE — Telephone Encounter (Signed)
lpmtcb 3/4 Cardiac CT results

## 2020-01-23 NOTE — Telephone Encounter (Signed)
-----   Message from Richardo Priest, MD sent at 01/23/2020  1:54 PM EST ----- Normal or stable result  Great results normal cardiac CTA

## 2020-01-23 NOTE — Telephone Encounter (Signed)
I spoke to the patient and she is wondering if she needs an appointment with Dr Bettina Gavia to f/u on Cardiac CT.  Please advise, thank you.

## 2020-01-23 NOTE — Telephone Encounter (Signed)
New Message:      Pt wants to know if she needs a follow up visit after her CT with Dr Bettina Gavia?

## 2020-01-23 NOTE — Telephone Encounter (Signed)
The patient has been notified of the Cardiac CT result and verbalized understanding.  All questions (if any) were answered. Frederik Schmidt, RN 01/23/2020 3:36 PM

## 2020-01-24 NOTE — Telephone Encounter (Signed)
Spoke with patient. She had the CT yesterday and it hasn't been read by Dr Bettina Gavia yet as he is ot of the office this week. Explained they will call with results next week and let her know if she needs an appointment or not at that time. Pt had no further questions.

## 2020-02-10 ENCOUNTER — Telehealth: Payer: Self-pay | Admitting: Cardiology

## 2020-02-10 NOTE — Telephone Encounter (Signed)
I am not sure how this got by me think this is the first time of seeing the report.  Cardiac calcium score is 0 and she has no coronary artery disease.  They discussed her having an echocardiogram looks like scarring of the lung recommend a noncontrast CT scan in 3 months to look for stability and if she wants I will order that test.

## 2020-02-10 NOTE — Telephone Encounter (Signed)
Patient is calling for her CT results. Would like a callback.

## 2020-02-10 NOTE — Telephone Encounter (Signed)
Will forward to Dr Bettina Gavia to review and comment on Cardiac CT done on 01/23/20

## 2020-02-10 NOTE — Telephone Encounter (Signed)
Left message to call office

## 2020-02-13 ENCOUNTER — Telehealth: Payer: Self-pay

## 2020-02-13 NOTE — Telephone Encounter (Signed)
Left message on patients voicemail to please return our call in regards to her last CT and in regard to scheduling a future CT if she agrees.

## 2020-02-14 NOTE — Telephone Encounter (Signed)
Spoke with patient. See other telephone encounter for further detains and documentation.

## 2020-02-14 NOTE — Telephone Encounter (Signed)
Spoke with patient regarding her results and recommendations from Dr. Bettina Gavia. Patient states that she does not want to schedule another CT at this time but will let us know if she decides to.    Encouraged patient to call back with any questions or concerns.

## 2020-02-14 NOTE — Telephone Encounter (Signed)
Follow Up:; ° ° °Returning your call. °

## 2020-03-09 ENCOUNTER — Other Ambulatory Visit: Payer: Self-pay | Admitting: Cardiology

## 2020-06-25 IMAGING — CT CT CARDIAC CORONARY ARTERY CALCIUM SCORE
4 series · 12 of 20 positions shown, 13 images · non-contrast
Comparison: None.
COMPARISON: None.

Addendum:
EXAM:
OVER-READ INTERPRETATION  CT CHEST

The following report is an over-read performed by radiologist Dr.
Mayrran Castellon [REDACTED] on 11/18/2019. This
over-read does not include interpretation of cardiac or coronary
anatomy or pathology. The calcium score interpretation by the
cardiologist is attached.
CLINICAL DATA: Risk stratification, chestpain
Coronary Calcium Score
TECHNIQUE: The patient was scanned on a Siemens Go Top. Axial non-contrast 2 mm
slices were carried out through the heart. The data set was analyzed
on a dedicated work station and scored using the Agatson method.

[Series 2: sa36 calcium scoring 2.00 · axial · 0.36mm/px · z∈[-1376,-1295]mm · 4 of 90 slices shown, 5 images]
[im 18/90  vessel]
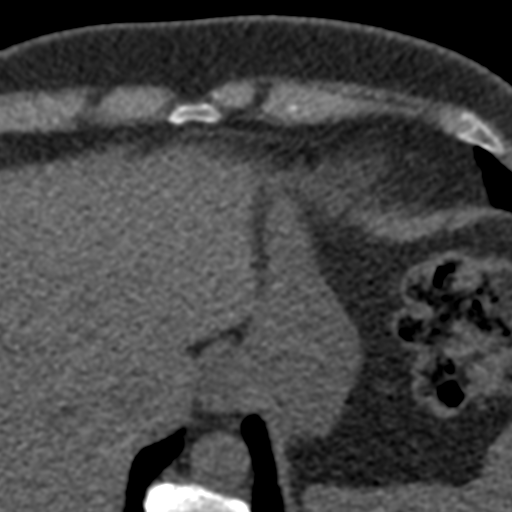
[im 18/90  lung]
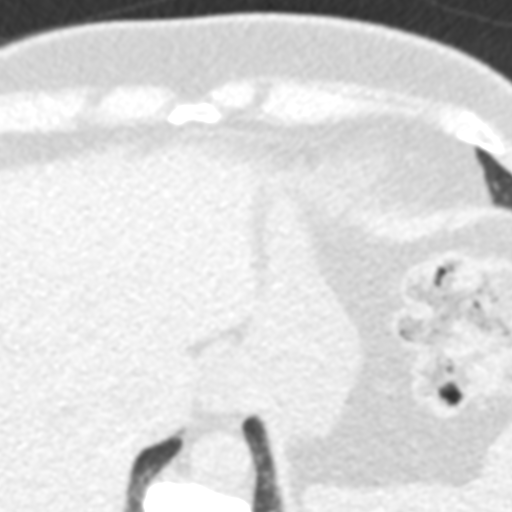
[im 36/90  vessel]
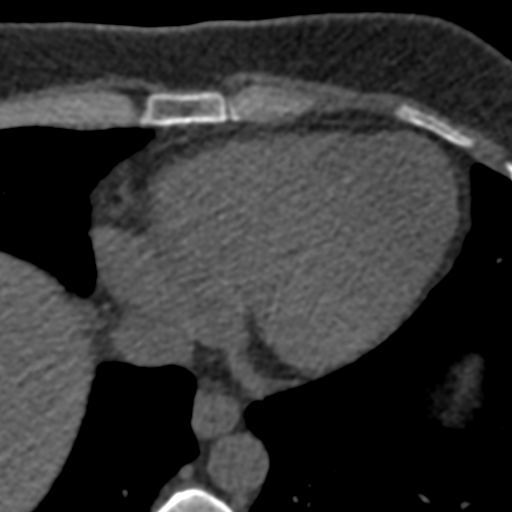
[im 54/90  vessel]
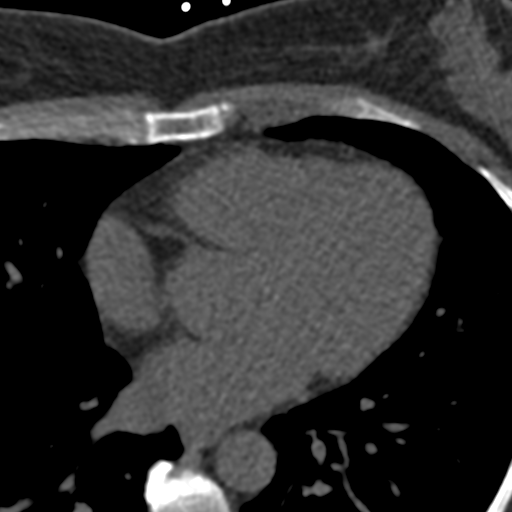
[im 72/90  vessel]
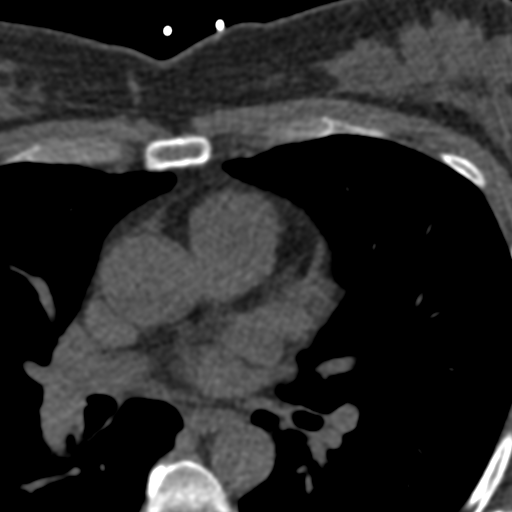

[Series 3: qr36 calcium scoring 2.00 · axial · 0.36mm/px · z∈[-1376,-1295]mm · 4 of 90 slices shown]
[im 18/90  vessel]
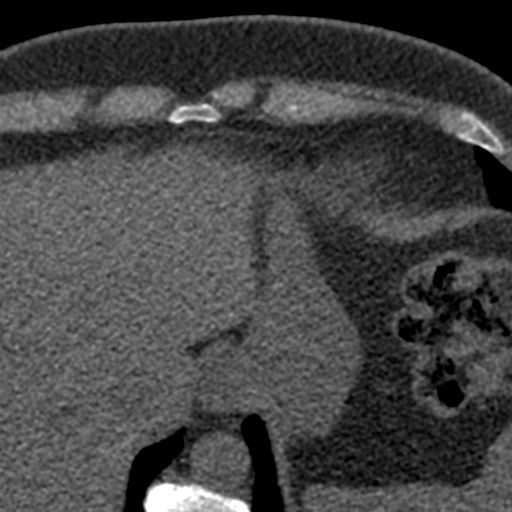
[im 36/90  vessel]
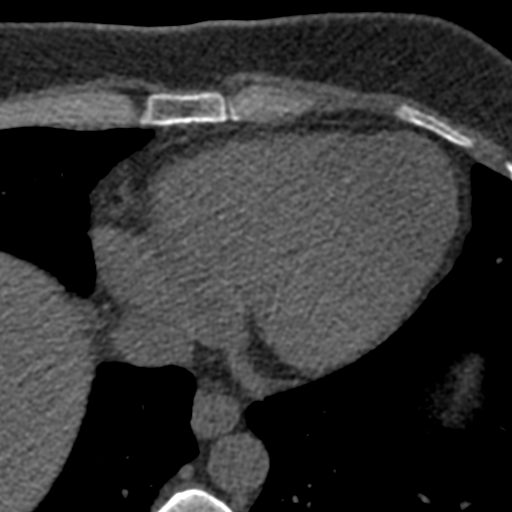
[im 54/90  vessel]
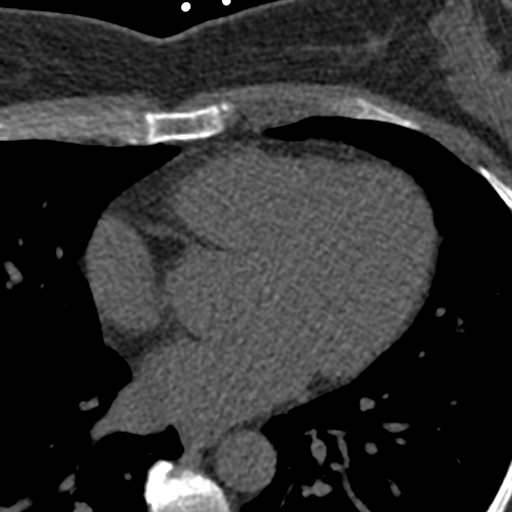
[im 72/90  vessel]
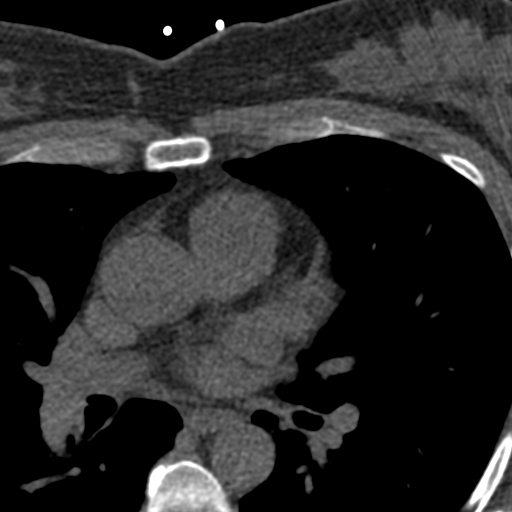

[Series 4: full fov st calcium scoring 2.00 · axial · 0.64mm/px · z∈[-1358,-1314]mm · 2 of 68 slices shown]
[im 23/68  vessel]
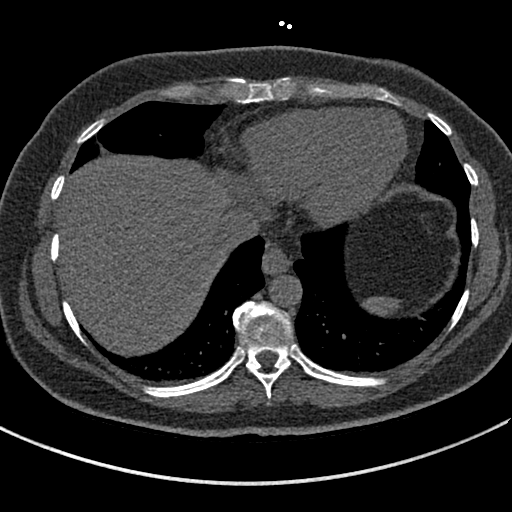
[im 45/68  vessel]
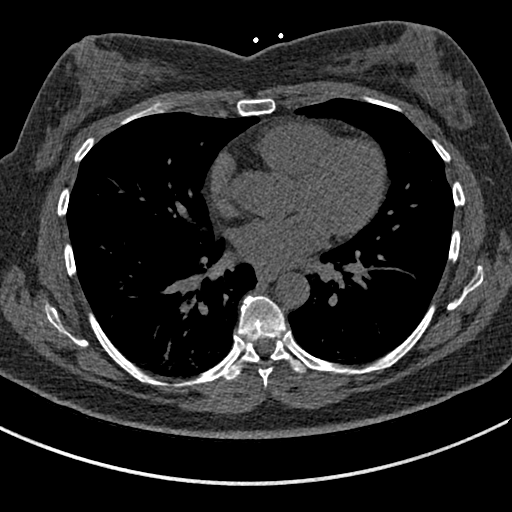

[Series 9: full fov lungs calcium scoring 2.00 ax · axial · 0.64mm/px · z∈[-1358,-1314]mm · 2 of 68 slices shown]
[im 23/68  vessel]
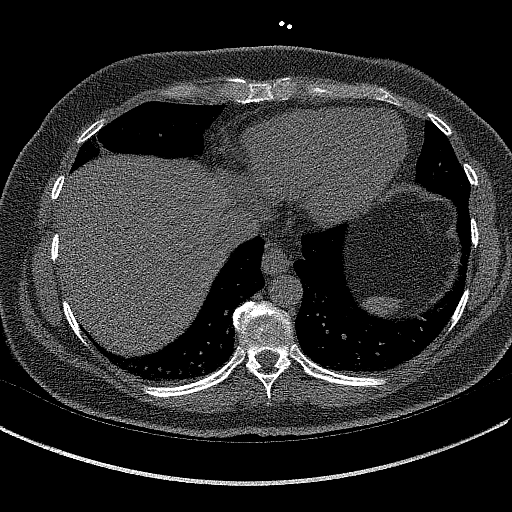
[im 45/68  vessel]
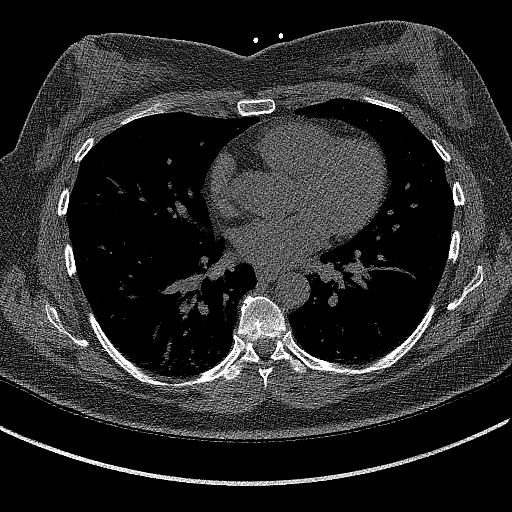

[12 of 20 positions shown; findings below may reference images not displayed]

FINDINGS: Vascular: Heart is normal size.  Visualized aorta normal caliber.

Mediastinum/Nodes: No adenopathy in the lower mediastinum or hila.

Lungs/Pleura: Linear atelectasis or scarring at the right lung base
anteriorly. Otherwise, no confluent opacities or effusions.

Upper Abdomen: Imaging into the upper abdomen shows no acute
findings.

Musculoskeletal: Chest wall soft tissues are unremarkable. No acute
bony abnormality.
IMPRESSION: No acute or significant extracardiac abnormality.
FINDINGS: Non-cardiac: See separate report from [REDACTED].

Ascending Aorta: normal

Pericardium: Normal

Coronary arteries: Normal origin
IMPRESSION: Coronary calcium score of 0. Patient is therefore low risk for
current and near term coronary vascular events.

*** End of Addendum ***
EXAM:
OVER-READ INTERPRETATION  CT CHEST

The following report is an over-read performed by radiologist Dr.
Mayrran Castellon [REDACTED] on 11/18/2019. This
over-read does not include interpretation of cardiac or coronary
anatomy or pathology. The calcium score interpretation by the
cardiologist is attached.
FINDINGS: Vascular: Heart is normal size.  Visualized aorta normal caliber.

Mediastinum/Nodes: No adenopathy in the lower mediastinum or hila.

Lungs/Pleura: Linear atelectasis or scarring at the right lung base
anteriorly. Otherwise, no confluent opacities or effusions.

Upper Abdomen: Imaging into the upper abdomen shows no acute
findings.

Musculoskeletal: Chest wall soft tissues are unremarkable. No acute
bony abnormality.
IMPRESSION: No acute or significant extracardiac abnormality.

## 2021-09-01 ENCOUNTER — Other Ambulatory Visit: Payer: Self-pay | Admitting: Obstetrics & Gynecology

## 2021-09-01 DIAGNOSIS — N6320 Unspecified lump in the left breast, unspecified quadrant: Secondary | ICD-10-CM

## 2021-09-13 ENCOUNTER — Ambulatory Visit
Admission: RE | Admit: 2021-09-13 | Discharge: 2021-09-13 | Disposition: A | Discharge status: Home / Self Care | Payer: 59 | Source: Ambulatory Visit | Attending: Obstetrics & Gynecology | Admitting: Obstetrics & Gynecology

## 2021-09-13 ENCOUNTER — Other Ambulatory Visit: Payer: Self-pay

## 2021-09-13 ENCOUNTER — Ambulatory Visit
Admission: RE | Admit: 2021-09-13 | Discharge: 2021-09-13 | Disposition: A | Discharge status: Home / Self Care | Payer: No Typology Code available for payment source | Source: Ambulatory Visit | Attending: Obstetrics & Gynecology | Admitting: Obstetrics & Gynecology

## 2021-09-13 DIAGNOSIS — N6320 Unspecified lump in the left breast, unspecified quadrant: Secondary | ICD-10-CM

## 2022-09-12 ENCOUNTER — Other Ambulatory Visit: Payer: Self-pay | Admitting: Obstetrics and Gynecology

## 2022-09-12 DIAGNOSIS — R928 Other abnormal and inconclusive findings on diagnostic imaging of breast: Secondary | ICD-10-CM

## 2022-09-15 ENCOUNTER — Ambulatory Visit (AMBULATORY_SURGERY_CENTER): Payer: No Typology Code available for payment source

## 2022-09-15 VITALS — Ht 60.0 in | Wt 165.0 lb

## 2022-09-15 DIAGNOSIS — Z1211 Encounter for screening for malignant neoplasm of colon: Secondary | ICD-10-CM

## 2022-09-15 MED ORDER — NA SULFATE-K SULFATE-MG SULF 17.5-3.13-1.6 GM/177ML PO SOLN: 1.0000 | ORAL | 0 refills | Status: DC

## 2022-09-15 NOTE — Progress Notes (Signed)
No egg or soy allergy known to patient  No issues known to pt with past sedation with any surgeries or procedures Patient denies ever being told they had issues or difficulty with intubation  No FH of Malignant Hyperthermia Pt is not on diet pills Pt is not on  home 02  Pt is not on blood thinners  Pt denies issues with constipation  No A fib or A flutter Have any cardiac testing pending--denied Pt instructed to use Single[employer] or GoodRx for a price reduction on prep   PV conducted over phone. Suprep instructions reviewed and mailed with sample consent to verified address.  Patient's chart reviewed by Brenda Riddle CNRA prior to previsit and patient appropriate for the [facility].  Previsit completed and red dot placed by patient's name on their procedure day (on provider's schedule).    

## 2022-09-17 ENCOUNTER — Ambulatory Visit
Admission: RE | Admit: 2022-09-17 | Discharge: 2022-09-17 | Disposition: A | Discharge status: Home / Self Care | Payer: No Typology Code available for payment source | Source: Ambulatory Visit | Attending: Obstetrics and Gynecology | Admitting: Obstetrics and Gynecology

## 2022-09-17 ENCOUNTER — Other Ambulatory Visit: Payer: Self-pay | Admitting: Obstetrics and Gynecology

## 2022-09-17 DIAGNOSIS — R921 Mammographic calcification found on diagnostic imaging of breast: Secondary | ICD-10-CM

## 2022-09-17 DIAGNOSIS — R928 Other abnormal and inconclusive findings on diagnostic imaging of breast: Secondary | ICD-10-CM

## 2022-09-23 ENCOUNTER — Encounter: Payer: Self-pay | Admitting: Gastroenterology

## 2022-09-30 ENCOUNTER — Ambulatory Visit
Admission: RE | Admit: 2022-09-30 | Discharge: 2022-09-30 | Disposition: A | Discharge status: Home / Self Care | Payer: No Typology Code available for payment source | Source: Ambulatory Visit | Attending: Obstetrics and Gynecology | Admitting: Obstetrics and Gynecology

## 2022-09-30 DIAGNOSIS — R921 Mammographic calcification found on diagnostic imaging of breast: Secondary | ICD-10-CM

## 2022-09-30 HISTORY — PX: BREAST BIOPSY: SHX20

## 2022-10-04 ENCOUNTER — Ambulatory Visit (AMBULATORY_SURGERY_CENTER): Payer: No Typology Code available for payment source | Admitting: Gastroenterology

## 2022-10-04 ENCOUNTER — Encounter: Payer: Self-pay | Admitting: Gastroenterology

## 2022-10-04 VITALS — BP 156/94 | HR 78 | Temp 98.3°F | Resp 10 | Ht 60.0 in | Wt 165.0 lb

## 2022-10-04 DIAGNOSIS — Z1211 Encounter for screening for malignant neoplasm of colon: Secondary | ICD-10-CM | POA: Diagnosis present

## 2022-10-04 MED ORDER — SODIUM CHLORIDE 0.9 % IV SOLN: 500.0000 mL | Freq: Once | INTRAVENOUS | Status: DC

## 2022-10-04 NOTE — Progress Notes (Signed)
Pt's states no medical or surgical changes since previsit or office visit. 

## 2022-10-04 NOTE — Progress Notes (Signed)
[facility] History and Physical   Primary Care Physician:  Greig Right, MD   Reason for Procedure:   [facility] screeing  Plan:    colon     HPI: Brenda Riddle is a 51 y.o. female   No nausea, vomiting, heartburn, regurgitation, odynophagia or dysphagia.  No significant diarrhea or constipation.  No melena or hematochezia. No unintentional weight loss. No abdominal pain.  Past Medical History:  Diagnosis Date   Hypertension     Past Surgical History:  Procedure Laterality Date   BREAST BIOPSY Right 09/30/2022   MM RT BREAST BX W LOC DEV 1ST LESION IMAGE BX SPEC STEREO GUIDE 09/30/2022 [facility]   CESAREAN SECTION  2000   CHOLECYSTECTOMY     PARTIAL HYSTERECTOMY  2006    Prior to Admission medications   Medication Sig Start Date End Date Taking? Authorizing Provider  ECHINACEA-VITAMIN C PO Take 1 tablet by mouth 2 (two) times daily.   Yes [provider]  fluticasone (FLONASE) 50 MCG/ACT nasal spray INSTILL 2 SPRAYS EACH NOSTRIL EVERY NIGHT. 07/26/18  Yes [provider]  lisinopril (PRINIVIL,ZESTRIL) 10 MG tablet Take 10 mg by mouth daily.  08/24/18  Yes [provider]  Multiple Vitamins-Minerals (HAIR SKIN AND NAILS FORMULA) TABS Take 1 tablet by mouth 2 (two) times daily.   Yes [provider]  Multiple Vitamins-Minerals (MULTIVITAMIN WOMEN PO) Take 1 tablet by mouth daily.   Yes [provider]  NON FORMULARY Take 2 tablets by mouth daily. Vitamin D3, Echinacea, and ZINC   Yes [provider]  ELDERBERRY PO Take 1 tablet by mouth 2 (two) times daily. Patient not taking: Reported on 09/15/2022    [provider]  estradiol (VIVELLE-DOT) 0.1 MG/24HR patch Place 1 patch onto the skin 2 (two) times a week. Patient not taking: Reported on 10/04/2022 04/20/22   [provider]  metoprolol tartrate (LOPRESSOR) 50 MG tablet Take 1 tablet (50 mg total) by mouth once for 1 dose. Take 2  tablets (100mg ) 2 hours prior to procedure. 10/11/19 10/11/19  Richardo Priest, MD    Current Outpatient Medications  Medication Sig Dispense Refill   ECHINACEA-VITAMIN C PO Take 1 tablet by mouth 2 (two) times daily.     fluticasone (FLONASE) 50 MCG/ACT nasal spray INSTILL 2 SPRAYS EACH NOSTRIL EVERY NIGHT.  5   lisinopril (PRINIVIL,ZESTRIL) 10 MG tablet Take 10 mg by mouth daily.      Multiple Vitamins-Minerals (HAIR SKIN AND NAILS FORMULA) TABS Take 1 tablet by mouth 2 (two) times daily.     Multiple Vitamins-Minerals (MULTIVITAMIN WOMEN PO) Take 1 tablet by mouth daily.     NON FORMULARY Take 2 tablets by mouth daily. Vitamin D3, Echinacea, and ZINC     ELDERBERRY PO Take 1 tablet by mouth 2 (two) times daily. (Patient not taking: Reported on 09/15/2022)     estradiol (VIVELLE-DOT) 0.1 MG/24HR patch Place 1 patch onto the skin 2 (two) times a week. (Patient not taking: Reported on 10/04/2022)     metoprolol tartrate (LOPRESSOR) 50 MG tablet Take 1 tablet (50 mg total) by mouth once for 1 dose. Take 2 tablets (100mg ) 2 hours prior to procedure. 2 tablet 0   Current Facility-Administered Medications  Medication Dose Route Frequency Provider Last Rate Last Admin   0.9 %  sodium chloride infusion  500 mL Intravenous Once Jackquline Denmark, MD        Allergies as of 10/04/2022 - Review Complete 10/04/2022  Allergen  Reaction Noted   Meloxicam Rash 10/09/2018   Penicillin g Rash 12/18/2017    Family History  Problem Relation Age of Onset   Hypertension Mother    Heart attack Father    Diabetes Maternal Aunt    Colon cancer Neg Hx    Esophageal cancer Neg Hx    Stomach cancer Neg Hx    Rectal cancer Neg Hx     Social History   Socioeconomic History   Marital status: Divorced    Spouse name: Not on file   Number of children: Not on file   Years of education: Not on file   Highest education level: Not on file  Occupational History   Not on file  Tobacco Use   Smoking status:  Never   Smokeless tobacco: Never  Vaping Use   Vaping Use: Not on file  Substance and Sexual Activity   Alcohol use: Yes    Comment: occ   Drug use: Never   Sexual activity: Not on file  Other Topics Concern   Not on file  Social History Narrative   Not on file   Social Determinants of Health   Financial Resource Strain: Not on file  Food Insecurity: Not on file  Transportation Needs: Not on file  Physical Activity: Not on file  Stress: Not on file  Social Connections: Not on file  Intimate Partner Violence: Not on file    Review of Systems: Positive for none All other review of systems negative except as mentioned in the HPI.  Physical Exam: Vital signs in last 24 hours: @VSRANGES @   General:   Alert,  Well-developed, well-nourished, pleasant and cooperative in NAD Lungs:  Clear throughout to auscultation.   Heart:  Regular rate and rhythm; no murmurs, clicks, rubs,  or gallops. Abdomen:  Soft, nontender and nondistended. Normal bowel sounds.   Neuro/Psych:  Alert and cooperative. Normal mood and affect. A and O x 3    No significant changes were identified.  The patient continues to be an appropriate candidate for the planned procedure and anesthesia.   , MD. [facility] 10/04/2022 9:17 AM@

## 2022-10-04 NOTE — Patient Instructions (Addendum)
- Patient has a contact number available for emergencies. The signs and symptoms of potential delayed complications were discussed with the patient. Return to normal activities tomorrow. Written discharge instructions were provided to the patient. - High fiber diet. - Continue present medications. - Repeat colonoscopy in 10 years for screening purposes. Earlier, if with any new problems or change in family history - Use Preparation H as needed in case of any problems with hemorrhoids. - The findings and recommendations were discussed with the patient's family.  Handouts on Hemorrhoids and Diverticulitis given.  YOU HAD AN ENDOSCOPIC PROCEDURE TODAY AT THE [facility]:   Refer to the procedure report that was given to you for any specific questions about what was found during the examination.  If the procedure report does not answer your questions, please call your gastroenterologist to clarify.  If you requested that your care partner not be given the details of your procedure findings, then the procedure report has been included in a sealed envelope for you to review at your convenience later.  YOU SHOULD EXPECT: Some feelings of bloating in the abdomen. Passage of more gas than usual.  Walking can help get rid of the air that was put into your GI tract during the procedure and reduce the bloating. If you had a lower endoscopy (such as a colonoscopy or flexible sigmoidoscopy) you may notice spotting of blood in your stool or on the toilet paper. If you underwent a bowel prep for your procedure, you may not have a normal bowel movement for a few days.  Please Note:  You might notice some irritation and congestion in your nose or some drainage.  This is from the oxygen used during your procedure.  There is no need for concern and it should clear up in a day or so.  SYMPTOMS TO REPORT IMMEDIATELY:  Following lower endoscopy (colonoscopy or flexible sigmoidoscopy):  Excessive amounts  of blood in the stool  Significant tenderness or worsening of abdominal pains  Swelling of the abdomen that is new, acute  Fever of 100F or higher  For urgent or emergent issues, a gastroenterologist can be reached at any hour by calling [phone]. Do not use MyChart messaging for urgent concerns.    DIET:  We do recommend a small meal at first, but then you may proceed to your regular diet.  Drink plenty of fluids but you should avoid alcoholic beverages for 24 hours.  ACTIVITY:  You should plan to take it easy for the rest of today and you should NOT DRIVE or use heavy machinery until tomorrow (because of the sedation medicines used during the test).    FOLLOW UP: Our staff will call the number listed on your records the next business day following your procedure.  We will call around 7:15- 8:00 am to check on you and address any questions or concerns that you may have regarding the information given to you following your procedure. If we do not reach you, we will leave a message.     If any biopsies were taken you will be contacted by phone or by letter within the next 1-3 weeks.  Please call us at [phone] if you have not heard about the biopsies in 3 weeks.    SIGNATURES/CONFIDENTIALITY: You and/or your care partner have signed paperwork which will be entered into your electronic medical record.  These signatures attest to the fact that that the information above on your After Visit Summary has been  reviewed and is understood.  Full responsibility of the confidentiality of this discharge information lies with you and/or your care-partner. 

## 2022-10-04 NOTE — Progress Notes (Signed)
To pacu, VSS. Report to [email or web address] 

## 2022-10-04 NOTE — Op Note (Signed)
[facility] Patient Name: Brenda Riddle Procedure Date: 10/04/2022 9:07 AM MRN: [id number] Endoscopist: Lynann Bologna , MD, [id number] Age: 51 Referring MD:  Date of Birth: Apr 09, 1971 Gender: Female Account #: [id number] Procedure:                Colonoscopy Indications:              Screening for colorectal malignant neoplasm Medicines:                Monitored Anesthesia Care Procedure:                Pre-Anesthesia Assessment:                           - Prior to the procedure, a History and Physical                            was performed, and patient medications and                            allergies were reviewed. The patient's tolerance of                            previous anesthesia was also reviewed. The risks                            and benefits of the procedure and the sedation                            options and risks were discussed with the patient.                            All questions were answered, and informed consent                            was obtained. Prior Anticoagulants: The patient has                            taken no anticoagulant or antiplatelet agents. ASA                            Grade Assessment: II - A patient with mild systemic                            disease. After reviewing the risks and benefits,                            the patient was deemed in satisfactory condition to                            undergo the procedure.                           After obtaining informed consent, the colonoscope  was passed under direct vision. Throughout the                            procedure, the patient's blood pressure, pulse, and                            oxygen saturations were monitored continuously. The                            PCF-HQ190L Colonoscope was introduced through the                            anus and advanced to the 2 cm into the ileum. The                            colonoscopy  was performed without difficulty. The                            patient tolerated the procedure well. The quality                            of the bowel preparation was good. The terminal                            ileum, ileocecal valve, appendiceal orifice, and                            rectum were photographed. Scope In: 9:21:10 AM Scope Out: 9:30:18 AM Scope Withdrawal Time: 0 hours 6 minutes 30 seconds  Total Procedure Duration: 0 hours 9 minutes 8 seconds  Findings:                 A few medium-mouthed diverticula were found in the                            sigmoid colon and ascending colon. Most prominently                            in the ascending colon.                           Non-bleeding internal hemorrhoids were found during                            retroflexion. The hemorrhoids were small and Grade                            I (internal hemorrhoids that do not prolapse)                            except for one channel of grade 2 internal                            hemorrhoids.Marland Kitchen  The terminal ileum appeared normal.                           The exam was otherwise without abnormality on                            direct and retroflexion views. Complications:            No immediate complications. Estimated Blood Loss:     Estimated blood loss: none. Impression:               - Mild predominantly right colonic diverticulosis.                           - Non-bleeding internal hemorrhoids.                           - The examined portion of the ileum was normal.                           - The examination was otherwise normal on direct                            and retroflexion views.                           - No specimens collected. Recommendation:           - Patient has a contact number available for                            emergencies. The signs and symptoms of potential                            delayed complications were discussed  with the                            patient. Return to normal activities tomorrow.                            Written discharge instructions were provided to the                            patient.                           - High fiber diet.                           - Continue present medications.                           - Repeat colonoscopy in 10 years for screening                            purposes. Earlier, if with any new problems or  change in family history                           - Use Preparation H as needed in case of any                            problems with hemorrhoids.                           - The findings and recommendations were discussed                            with the patient's family. Lynann Bolognaajesh Deryk Bozman, MD 10/04/2022 9:34:54 AM This report has been signed electronically.

## 2022-10-05 ENCOUNTER — Telehealth: Payer: Self-pay | Admitting: *Deleted

## 2022-10-05 NOTE — Telephone Encounter (Signed)
  Follow up Call-     10/04/2022    8:03 AM  Call back number  Post procedure Call Back phone  # [phone]  Permission to leave phone message Yes     Patient questions:   Message left to call us if necessary.

## 2022-10-06 ENCOUNTER — Other Ambulatory Visit: Payer: Self-pay

## 2022-11-11 ENCOUNTER — Other Ambulatory Visit: Payer: Self-pay | Admitting: Obstetrics and Gynecology

## 2022-11-11 DIAGNOSIS — R928 Other abnormal and inconclusive findings on diagnostic imaging of breast: Secondary | ICD-10-CM

## 2023-04-03 ENCOUNTER — Other Ambulatory Visit: Payer: Self-pay | Admitting: Obstetrics and Gynecology

## 2023-04-03 ENCOUNTER — Ambulatory Visit
Admission: RE | Admit: 2023-04-03 | Discharge: 2023-04-03 | Disposition: A | Discharge status: Home / Self Care | Payer: No Typology Code available for payment source | Source: Ambulatory Visit | Attending: Obstetrics and Gynecology | Admitting: Obstetrics and Gynecology

## 2023-04-03 DIAGNOSIS — R928 Other abnormal and inconclusive findings on diagnostic imaging of breast: Secondary | ICD-10-CM

## 2023-04-03 DIAGNOSIS — R921 Mammographic calcification found on diagnostic imaging of breast: Secondary | ICD-10-CM

## 2023-09-26 ENCOUNTER — Ambulatory Visit
Admission: RE | Admit: 2023-09-26 | Discharge: 2023-09-26 | Disposition: A | Discharge status: Home / Self Care | Payer: No Typology Code available for payment source | Source: Ambulatory Visit | Attending: Obstetrics and Gynecology | Admitting: Obstetrics and Gynecology

## 2023-09-26 DIAGNOSIS — R921 Mammographic calcification found on diagnostic imaging of breast: Secondary | ICD-10-CM

## 2024-01-18 ENCOUNTER — Other Ambulatory Visit: Payer: Self-pay | Admitting: Obstetrics and Gynecology

## 2024-01-18 DIAGNOSIS — R921 Mammographic calcification found on diagnostic imaging of breast: Secondary | ICD-10-CM

## 2024-06-07 ENCOUNTER — Encounter: Payer: Self-pay | Admitting: Advanced Practice Midwife

## 2024-07-22 ENCOUNTER — Ambulatory Visit (HOSPITAL_BASED_OUTPATIENT_CLINIC_OR_DEPARTMENT_OTHER)
Admission: EM | Admit: 2024-07-22 | Discharge: 2024-07-22 | Disposition: A | Discharge status: Home / Self Care | Attending: Family Medicine | Admitting: Family Medicine

## 2024-07-22 ENCOUNTER — Encounter (HOSPITAL_BASED_OUTPATIENT_CLINIC_OR_DEPARTMENT_OTHER): Payer: Self-pay | Admitting: Emergency Medicine

## 2024-07-22 DIAGNOSIS — J0100 Acute maxillary sinusitis, unspecified: Secondary | ICD-10-CM

## 2024-07-22 DIAGNOSIS — S00411A Abrasion of right ear, initial encounter: Secondary | ICD-10-CM | POA: Diagnosis not present

## 2024-07-22 MED ORDER — DOXYCYCLINE HYCLATE 100 MG PO CAPS: 100.0000 mg | ORAL_CAPSULE | Freq: Two times a day (BID) | ORAL | 0 refills | Status: AC

## 2024-07-22 NOTE — ED Triage Notes (Signed)
 Pt reports sinus issues last week and facial pressure after her shower last night she felt something wet at her right ear and she had red drainage coming out her ear.

## 2024-07-22 NOTE — Discharge Instructions (Signed)
 Maxillary sinusitis: Continue Brenda Riddle twice daily.  Doxycycline  100 mg twice daily for 10 days.  Get plenty of fluids and rest.  May use OTC allergy medicine, such as cetirizine, for nasal congestion.  Abrasion of right ear: Bleeding cauterized with silver nitrate stick.  Do not put anything in the ear if possible for some time.  Avoid Advil Aleve ibuprofen and any other NSAIDs or aspirin products.  May take acetaminophen/Tylenol for pain if needed stop all herbal supplements for 2 to 3 days as they may have aspirin type properties and could increase bleeding.  Follow-up if symptoms do not improve, worsen or new symptoms occur.

## 2024-07-22 NOTE — ED Provider Notes (Signed)
 [facility]    CSN: [id number] Arrival date & time: 07/22/24  9170      History   Chief Complaint Chief Complaint  Patient presents with   Ear Drainage    HPI Brenda Riddle is a 53 y.o. female.   53 year old patient who reports sinus congestion, facial pain and upper dental pain since approximately 07/10/2024 or earlier.  She is doing Nettie pot rinses twice a day and getting a lot of discolored mucus out.  She has had some ear fullness.  Last night in the shower she felt something wet in her right ear and she had blood coming out of her ear.  She has been putting cotton balls in her ear all night and it just constantly bleeds and has not stopped.  She says she did not put anything in there she might of scratched the outer ear with her finger but she did not put anything inside the ear at all.  She is not on aspirin but she has taken a lot of Advil for sinus pressure and pain.   Ear Drainage Pertinent negatives include no chest pain, no abdominal pain and no shortness of breath.    Past Medical History:  Diagnosis Date   Hypertension     Patient Active Problem List   Diagnosis Date Noted   Maxillary sinusitis 06/19/2018   Nasal congestion 06/19/2018    Past Surgical History:  Procedure Laterality Date   BREAST BIOPSY Right 09/30/2022   MM RT BREAST BX W LOC DEV 1ST LESION IMAGE BX SPEC STEREO GUIDE 09/30/2022 [facility]   CESAREAN SECTION  2000   CHOLECYSTECTOMY     PARTIAL HYSTERECTOMY  2006    OB History   No obstetric history on file.      Home Medications    Prior to Admission medications   Medication Sig Start Date End Date Taking? Authorizing Provider  doxycycline  (VIBRAMYCIN ) 100 MG capsule Take 1 capsule (100 mg total) by mouth 2 (two) times daily for 10 days. 07/22/24 08/01/24 Yes Ival Domino, FNP  estradiol (VIVELLE-DOT) 0.1 MG/24HR patch Place 1 patch onto the skin 2 (two) times a week. 04/20/22  Yes [provider]   lisinopril (PRINIVIL,ZESTRIL) 10 MG tablet Take 10 mg by mouth daily.  08/24/18  Yes [provider]  ECHINACEA-VITAMIN C PO Take 1 tablet by mouth 2 (two) times daily.    [provider]  ELDERBERRY PO Take 1 tablet by mouth 2 (two) times daily. Patient not taking: Reported on 09/15/2022    [provider]  fluticasone (FLONASE) 50 MCG/ACT nasal spray INSTILL 2 SPRAYS EACH NOSTRIL EVERY NIGHT. 07/26/18   [provider]  metoprolol  tartrate (LOPRESSOR ) 50 MG tablet Take 1 tablet (50 mg total) by mouth once for 1 dose. Take 2 tablets (100mg ) 2 hours prior to procedure. 10/11/19 10/11/19  Monetta Redell PARAS, MD  Multiple Vitamins-Minerals (HAIR SKIN AND NAILS FORMULA) TABS Take 1 tablet by mouth 2 (two) times daily.    [provider]  Multiple Vitamins-Minerals (MULTIVITAMIN WOMEN PO) Take 1 tablet by mouth daily.    [provider]  NON FORMULARY Take 2 tablets by mouth daily. Vitamin D3, Echinacea, and ZINC    [provider]    Family History Family History  Problem Relation Age of Onset   Hypertension Mother    Heart attack Father    Diabetes Maternal Aunt    Colon cancer Neg Hx    Esophageal cancer Neg Hx  Stomach cancer Neg Hx    Rectal cancer Neg Hx     Social History Social History   Tobacco Use   Smoking status: Never   Smokeless tobacco: Never  Substance Use Topics   Alcohol use: Yes    Comment: occ   Drug use: Never     Allergies   Meloxicam and Penicillin g   Review of Systems Review of Systems  Constitutional:  Negative for chills and fever.  HENT:  Positive for congestion, ear discharge, ear pain, postnasal drip, rhinorrhea, sinus pressure and sinus pain. Negative for sore throat.   Eyes:  Negative for pain and visual disturbance.  Respiratory:  Negative for cough and shortness of breath.   Cardiovascular:  Negative for chest pain and palpitations.  Gastrointestinal:  Negative for abdominal pain,  constipation, diarrhea, nausea and vomiting.  Genitourinary:  Negative for dysuria and hematuria.  Musculoskeletal:  Negative for arthralgias and back pain.  Skin:  Negative for color change and rash.  Neurological:  Negative for seizures and syncope.  All other systems reviewed and are negative.    Physical Exam Triage Vital Signs ED Triage Vitals [07/22/24 0843]  Encounter Vitals Group     BP (!) 155/98     Girls Systolic BP Percentile      Girls Diastolic BP Percentile      Boys Systolic BP Percentile      Boys Diastolic BP Percentile      Pulse Rate 74     Resp 18     Temp 97.8 F (36.6 C)     Temp Source Oral     SpO2 96 %     Weight      Height      Head Circumference      Peak Flow      Pain Score 0     Pain Loc      Pain Education      Exclude from Growth Chart    No data found.  Updated Vital Signs BP (!) 155/98 (BP Location: Right Arm)   Pulse 74   Temp 97.8 F (36.6 C) (Oral)   Resp 18   SpO2 96%   Visual Acuity Right Eye Distance:   Left Eye Distance:   Bilateral Distance:    Right Eye Near:   Left Eye Near:    Bilateral Near:     Physical Exam Vitals and nursing note reviewed.  Constitutional:      General: She is not in acute distress.    Appearance: She is well-developed. She is not ill-appearing or toxic-appearing.  HENT:     Head: Normocephalic and atraumatic.     Right Ear: Hearing, tympanic membrane and external ear normal. Drainage (Abrasion deep in the right ear canal with a constant drip of bright red blood.  Canal is appears normal without erythema, edema.) present.     Left Ear: Hearing, tympanic membrane, ear canal and external ear normal.     Nose: Congestion and rhinorrhea present. Rhinorrhea is clear.     Right Sinus: Maxillary sinus tenderness present. No frontal sinus tenderness.     Left Sinus: Maxillary sinus tenderness present. No frontal sinus tenderness.     Mouth/Throat:     Lips: Pink.     Mouth: Mucous membranes  are moist.     Dentition: Does not have dentures. Dental tenderness (Upper dental pain that she thinks is from her sinuses.) present. No gingival swelling, dental caries, dental abscesses or gum lesions.  Pharynx: Uvula midline. No oropharyngeal exudate or posterior oropharyngeal erythema.     Tonsils: No tonsillar exudate.  Eyes:     Conjunctiva/sclera: Conjunctivae normal.     Pupils: Pupils are equal, round, and reactive to light.  Cardiovascular:     Rate and Rhythm: Normal rate and regular rhythm.     Heart sounds: S1 normal and S2 normal. No murmur heard. Pulmonary:     Effort: Pulmonary effort is normal. No respiratory distress.     Breath sounds: Normal breath sounds. No decreased breath sounds, wheezing, rhonchi or rales.  Abdominal:     General: Bowel sounds are normal.     Palpations: Abdomen is soft.     Tenderness: There is no abdominal tenderness.  Musculoskeletal:        General: No swelling.     Cervical back: Neck supple.  Lymphadenopathy:     Head:     Right side of head: No submental, submandibular, tonsillar, preauricular or posterior auricular adenopathy.     Left side of head: No submental, submandibular, tonsillar, preauricular or posterior auricular adenopathy.     Cervical: Cervical adenopathy present.     Right cervical: Superficial cervical adenopathy present.     Left cervical: Superficial cervical adenopathy present.  Skin:    General: Skin is warm and dry.     Capillary Refill: Capillary refill takes less than 2 seconds.     Findings: No rash.  Neurological:     Mental Status: She is alert and oriented to person, place, and time.  Psychiatric:        Mood and Affect: Mood normal.      UC Treatments / Results  Labs (all labs ordered are listed, but only abnormal results are displayed) Labs Reviewed - No data to display  EKG   Radiology No results found.  Procedures Procedures (including critical care time)  Medications Ordered in  UC Medications - No data to display  Initial Impression / Assessment and Plan / UC Course  I have reviewed the triage vital signs and the nursing notes.  Pertinent labs & imaging results that were available during my care of the patient were reviewed by me and considered in my medical decision making (see chart for details).  Plan of Care: Maxillary sinusitis: Continue Merrilyn Potts twice daily.  Doxycycline  100 mg twice daily for 10 days.  See discharge instructions for more patient education  Abrasion of right ear canal: See discharge instructions for more information.  The abrasion was cauterized with a silver nitrate stick and is no longer bleeding.  Follow-up if symptoms do not improve, worsen or new symptoms occur.  I reviewed the plan of care with the patient and/or the patient's guardian.  The patient and/or guardian had time to ask questions and acknowledged that the questions were answered.  I provided instruction on symptoms or reasons to return here or to go to an ER, if symptoms/condition did not improve, worsened or if new symptoms occurred.  Final Clinical Impressions(s) / UC Diagnoses   Final diagnoses:  Acute non-recurrent maxillary sinusitis  Abrasion of right ear canal, initial encounter     Discharge Instructions      Maxillary sinusitis: Continue Nettie Potts twice daily.  Doxycycline  100 mg twice daily for 10 days.  Get plenty of fluids and rest.  May use OTC allergy medicine, such as cetirizine, for nasal congestion.  Abrasion of right ear: Bleeding cauterized with silver nitrate stick.  Do not put anything in  the ear if possible for some time.  Avoid Advil Aleve ibuprofen and any other NSAIDs or aspirin products.  May take acetaminophen/Tylenol for pain if needed stop all herbal supplements for 2 to 3 days as they may have aspirin type properties and could increase bleeding.  Follow-up if symptoms do not improve, worsen or new symptoms occur.     ED  Prescriptions     Medication Sig Dispense Auth. Provider   doxycycline  (VIBRAMYCIN ) 100 MG capsule Take 1 capsule (100 mg total) by mouth 2 (two) times daily for 10 days. 20 capsule Ival Domino, FNP      PDMP not reviewed this encounter.   Ival Domino, FNP 07/22/24 [phone]

## 2024-09-26 ENCOUNTER — Other Ambulatory Visit: Payer: Self-pay | Admitting: Obstetrics and Gynecology

## 2024-09-26 ENCOUNTER — Ambulatory Visit
Admission: RE | Admit: 2024-09-26 | Discharge: 2024-09-26 | Disposition: A | Discharge status: Home / Self Care | Source: Ambulatory Visit | Attending: Obstetrics and Gynecology | Admitting: Obstetrics and Gynecology

## 2024-09-26 ENCOUNTER — Ambulatory Visit
Admission: RE | Admit: 2024-09-26 | Discharge: 2024-09-26 | Disposition: A | Discharge status: Home / Self Care | Payer: No Typology Code available for payment source | Source: Ambulatory Visit | Attending: Obstetrics and Gynecology | Admitting: Obstetrics and Gynecology

## 2024-09-26 DIAGNOSIS — R921 Mammographic calcification found on diagnostic imaging of breast: Secondary | ICD-10-CM

## 2024-09-26 DIAGNOSIS — N6310 Unspecified lump in the right breast, unspecified quadrant: Secondary | ICD-10-CM

## 2024-09-27 ENCOUNTER — Other Ambulatory Visit: Payer: Self-pay | Admitting: Obstetrics and Gynecology

## 2024-09-27 ENCOUNTER — Ambulatory Visit
Admission: RE | Admit: 2024-09-27 | Discharge: 2024-09-27 | Disposition: A | Discharge status: Home / Self Care | Source: Ambulatory Visit | Attending: Obstetrics and Gynecology | Admitting: Obstetrics and Gynecology

## 2024-09-27 DIAGNOSIS — N6310 Unspecified lump in the right breast, unspecified quadrant: Secondary | ICD-10-CM

## 2024-09-27 DIAGNOSIS — R921 Mammographic calcification found on diagnostic imaging of breast: Secondary | ICD-10-CM

## 2024-09-27 HISTORY — PX: BREAST BIOPSY: SHX20

## 2024-09-27 MED ORDER — IOPAMIDOL (ISOVUE-370) INJECTION 76%
100.0000 mL | Freq: Once | INTRAVENOUS | Status: AC | PRN
  Administered 2024-09-27: 100 mL via INTRAVENOUS

## 2024-09-30 LAB — SURGICAL PATHOLOGY
